# Patient Record
Sex: Female | Born: 1950
Health system: Southern US, Community
[De-identification: ages and names within clinical notes are randomized; demographics above are authoritative.]

## PROBLEM LIST (undated history)

## (undated) DIAGNOSIS — F419 Anxiety disorder, unspecified: Secondary | ICD-10-CM

## (undated) DIAGNOSIS — E663 Overweight: Secondary | ICD-10-CM

## (undated) DIAGNOSIS — N200 Calculus of kidney: Secondary | ICD-10-CM

## (undated) DIAGNOSIS — Z8619 Personal history of other infectious and parasitic diseases: Secondary | ICD-10-CM

## (undated) DIAGNOSIS — B019 Varicella without complication: Secondary | ICD-10-CM

## (undated) DIAGNOSIS — I701 Atherosclerosis of renal artery: Secondary | ICD-10-CM

## (undated) DIAGNOSIS — Z86010 Personal history of colonic polyps: Secondary | ICD-10-CM

## (undated) DIAGNOSIS — K219 Gastro-esophageal reflux disease without esophagitis: Secondary | ICD-10-CM

## (undated) DIAGNOSIS — K449 Diaphragmatic hernia without obstruction or gangrene: Secondary | ICD-10-CM

## (undated) DIAGNOSIS — K635 Polyp of colon: Secondary | ICD-10-CM

## (undated) DIAGNOSIS — E785 Hyperlipidemia, unspecified: Secondary | ICD-10-CM

## (undated) DIAGNOSIS — F329 Major depressive disorder, single episode, unspecified: Secondary | ICD-10-CM

## (undated) DIAGNOSIS — N6009 Solitary cyst of unspecified breast: Secondary | ICD-10-CM

## (undated) DIAGNOSIS — Z0000 Encounter for general adult medical examination without abnormal findings: Secondary | ICD-10-CM

## (undated) HISTORY — DX: Personal history of other infectious and parasitic diseases: Z86.19

## (undated) HISTORY — DX: Anxiety disorder, unspecified: F41.9

## (undated) HISTORY — DX: Gastro-esophageal reflux disease without esophagitis: K21.9

## (undated) HISTORY — DX: Hyperlipidemia, unspecified: E78.5

## (undated) HISTORY — DX: Atherosclerosis of renal artery: I70.1

## (undated) HISTORY — DX: Personal history of colonic polyps: Z86.010

## (undated) HISTORY — DX: Diaphragmatic hernia without obstruction or gangrene: K44.9

## (undated) HISTORY — DX: Calculus of kidney: N20.0

## (undated) HISTORY — DX: Varicella without complication: B01.9

## (undated) HISTORY — DX: Polyp of colon: K63.5

## (undated) HISTORY — DX: Overweight: E66.3

## (undated) HISTORY — DX: Major depressive disorder, single episode, unspecified: F32.9

## (undated) HISTORY — DX: Encounter for general adult medical examination without abnormal findings: Z00.00

## (undated) HISTORY — DX: Solitary cyst of unspecified breast: N60.09

---
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
## 1999-03-19 ENCOUNTER — Encounter: Admission: RE | Admit: 1999-03-19 | Discharge: 1999-03-19 | Payer: Self-pay | Admitting: Family Medicine

## 1999-03-19 ENCOUNTER — Encounter: Payer: Self-pay | Admitting: Family Medicine

## 2000-03-23 ENCOUNTER — Encounter: Payer: Self-pay | Admitting: Family Medicine

## 2000-03-23 ENCOUNTER — Encounter: Admission: RE | Admit: 2000-03-23 | Discharge: 2000-03-23 | Payer: Self-pay | Admitting: Family Medicine

## 2000-06-02 ENCOUNTER — Ambulatory Visit (HOSPITAL_COMMUNITY): Admission: RE | Admit: 2000-06-02 | Discharge: 2000-06-02 | Payer: Self-pay | Admitting: *Deleted

## 2001-03-26 ENCOUNTER — Encounter: Admission: RE | Admit: 2001-03-26 | Discharge: 2001-03-26 | Payer: Self-pay | Admitting: Family Medicine

## 2001-03-26 ENCOUNTER — Encounter: Payer: Self-pay | Admitting: Family Medicine

## 2002-03-28 ENCOUNTER — Encounter: Admission: RE | Admit: 2002-03-28 | Discharge: 2002-03-28 | Payer: Self-pay | Admitting: Family Medicine

## 2002-03-28 ENCOUNTER — Encounter: Payer: Self-pay | Admitting: Family Medicine

## 2003-03-31 ENCOUNTER — Encounter: Admission: RE | Admit: 2003-03-31 | Discharge: 2003-03-31 | Payer: Self-pay | Admitting: Family Medicine

## 2003-04-03 ENCOUNTER — Encounter: Admission: RE | Admit: 2003-04-03 | Discharge: 2003-04-03 | Payer: Self-pay | Admitting: Family Medicine

## 2004-04-06 ENCOUNTER — Encounter: Admission: RE | Admit: 2004-04-06 | Discharge: 2004-04-06 | Payer: Self-pay | Admitting: Family Medicine

## 2004-05-06 ENCOUNTER — Other Ambulatory Visit: Admission: RE | Admit: 2004-05-06 | Discharge: 2004-05-06 | Payer: Self-pay | Admitting: Family Medicine

## 2004-05-14 ENCOUNTER — Encounter: Admission: RE | Admit: 2004-05-14 | Discharge: 2004-05-14 | Payer: Self-pay | Admitting: Family Medicine

## 2004-08-16 HISTORY — PX: OTHER SURGICAL HISTORY: SHX169

## 2005-07-04 ENCOUNTER — Encounter: Admission: RE | Admit: 2005-07-04 | Discharge: 2005-07-04 | Payer: Self-pay | Admitting: Family Medicine

## 2005-07-12 ENCOUNTER — Encounter: Admission: RE | Admit: 2005-07-12 | Discharge: 2005-07-12 | Payer: Self-pay | Admitting: Family Medicine

## 2005-08-02 ENCOUNTER — Other Ambulatory Visit: Admission: RE | Admit: 2005-08-02 | Discharge: 2005-08-02 | Payer: Self-pay | Admitting: Family Medicine

## 2005-09-23 ENCOUNTER — Encounter: Payer: Self-pay | Admitting: Cardiovascular Disease

## 2005-10-06 ENCOUNTER — Ambulatory Visit: Payer: Self-pay | Admitting: Family Medicine

## 2005-10-11 ENCOUNTER — Encounter: Payer: Self-pay | Admitting: Cardiovascular Disease

## 2005-11-02 ENCOUNTER — Encounter: Payer: Self-pay | Admitting: Cardiovascular Disease

## 2005-12-28 ENCOUNTER — Encounter: Admission: RE | Admit: 2005-12-28 | Discharge: 2005-12-28 | Payer: Self-pay | Admitting: Family Medicine

## 2006-05-01 ENCOUNTER — Ambulatory Visit: Payer: Self-pay | Admitting: Family Medicine

## 2006-09-07 ENCOUNTER — Encounter: Admission: RE | Admit: 2006-09-07 | Discharge: 2006-09-07 | Payer: Self-pay | Admitting: Family Medicine

## 2006-12-15 ENCOUNTER — Ambulatory Visit: Payer: Self-pay | Admitting: Family Medicine

## 2007-08-30 ENCOUNTER — Ambulatory Visit: Payer: Self-pay | Admitting: Family Medicine

## 2007-09-21 ENCOUNTER — Encounter: Admission: RE | Admit: 2007-09-21 | Discharge: 2007-09-21 | Payer: Self-pay | Admitting: Family Medicine

## 2007-10-05 LAB — HM COLONOSCOPY: HM Colonoscopy: NORMAL

## 2008-03-20 ENCOUNTER — Ambulatory Visit: Payer: Self-pay | Admitting: Family Medicine

## 2008-09-22 ENCOUNTER — Encounter: Admission: RE | Admit: 2008-09-22 | Discharge: 2008-09-22 | Payer: Self-pay | Admitting: Family Medicine

## 2008-10-07 LAB — HM PAP SMEAR: HM Pap smear: NEGATIVE

## 2008-11-06 ENCOUNTER — Other Ambulatory Visit: Admission: RE | Admit: 2008-11-06 | Discharge: 2008-11-06 | Payer: Self-pay | Admitting: Family Medicine

## 2008-11-06 ENCOUNTER — Ambulatory Visit: Payer: Self-pay | Admitting: Family Medicine

## 2008-11-13 ENCOUNTER — Ambulatory Visit: Payer: Self-pay | Admitting: Cardiovascular Disease

## 2008-11-13 DIAGNOSIS — I739 Peripheral vascular disease, unspecified: Secondary | ICD-10-CM

## 2008-11-13 DIAGNOSIS — I701 Atherosclerosis of renal artery: Secondary | ICD-10-CM | POA: Insufficient documentation

## 2008-11-20 ENCOUNTER — Encounter: Payer: Self-pay | Admitting: Cardiovascular Disease

## 2008-12-05 ENCOUNTER — Encounter: Payer: Self-pay | Admitting: Cardiovascular Disease

## 2008-12-05 ENCOUNTER — Ambulatory Visit: Payer: Self-pay

## 2009-11-03 ENCOUNTER — Encounter: Admission: RE | Admit: 2009-11-03 | Discharge: 2009-11-03 | Payer: Self-pay | Admitting: Family Medicine

## 2009-11-03 LAB — HM MAMMOGRAPHY: HM Mammogram: NEGATIVE

## 2009-12-08 ENCOUNTER — Encounter: Payer: Self-pay | Admitting: Cardiovascular Disease

## 2009-12-09 ENCOUNTER — Ambulatory Visit: Payer: Self-pay

## 2009-12-09 ENCOUNTER — Encounter: Payer: Self-pay | Admitting: Cardiovascular Disease

## 2010-01-19 ENCOUNTER — Ambulatory Visit: Payer: Self-pay | Admitting: Cardiovascular Disease

## 2010-01-20 ENCOUNTER — Telehealth: Payer: Self-pay | Admitting: Cardiovascular Disease

## 2010-02-10 ENCOUNTER — Ambulatory Visit: Payer: Self-pay | Admitting: Physician Assistant

## 2010-05-09 ENCOUNTER — Encounter: Payer: Self-pay | Admitting: Family Medicine

## 2010-05-18 NOTE — Progress Notes (Signed)
Summary: Pt want to schedule a CT Scan  Phone Note Call from Patient Call back at Home Phone [phone]   Caller: Patient Summary of Call: Pt calling regarding scheduling CT Scan Initial call taken by: Judie Grieve,  January 20, 2010 10:17 AM  Follow-up for Phone Call        Dr. Clifton James does not wish to persue ordering a CTA at this time.  Whitney Maeola Sarah RN  January 20, 2010 3:32 PM

## 2010-05-18 NOTE — Miscellaneous (Signed)
Summary: Orders Update  Clinical Lists Changes  Orders: Added new Test order of Renal Artery Duplex (Renal Artery Duplex) - Signed 

## 2010-05-18 NOTE — Assessment & Plan Note (Signed)
Summary: f1y/ gd   Visit Type:  1 yr f/u Primary Provider:  Sharlot Gowda  CC:  no cardiac complaints today.  History of Present Illness: 60 yo WF with history of hyperlipidemia and renal artery stenosis with normal BP and normal renal function here today for PV follow up. She has been followed in the past by Dr. Allyson Sabal from [facility] and has had surveillance dopplers of her renal arteries every six months. Her RAS was diagnosed by primary care provider in 2007. Most recent dopplers in August 2011 showed 50% right renal artery stenosis and no evidence of Left renal artery stenosis. The kidney size was slightly decreased on the right and unchanged on the left. There was suggestion of elevated velocities in the left iliac artery. Her ABIs were normal on the right and left. She has had no claudication. She has had a normal stress Myoview in the past. She describes no chest pain, SOB or palpitations.    Current Medications (verified): 1)  Lipitor 20 Mg Tabs (Atorvastatin Calcium) .... Take One Tablet Once Daily 2)  Aspirin 81 Mg Tabs (Aspirin) .... Once Daily 3)  Multivitamins  Tabs (Multiple Vitamin) .... Once Daily  Allergies (verified): No Known Drug Allergies  Past History:  Past Medical History: Hyperlipidemia Renal artery stenosis   Social History: Reviewed history from 11/13/2008 and no changes required. 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 alcohol drinks per week Remote tobacco, stopped 20 y ago Single No children  Review of Systems  The patient denies fatigue, malaise, fever, weight gain/loss, vision loss, decreased hearing, hoarseness, chest pain, palpitations, shortness of breath, prolonged cough, wheezing, sleep apnea, coughing up blood, abdominal pain, blood in stool, nausea, vomiting, diarrhea, heartburn, incontinence, blood in urine, muscle weakness, joint pain, leg swelling, rash, skin lesions, headache, fainting, dizziness, depression, anxiety, enlarged lymph nodes,  easy bruising or bleeding, and environmental allergies.    Vital Signs:  Patient profile:   60 year old female Height:      67 inches Weight:      147 pounds BMI:     23.11 Pulse rate:   57 / minute Pulse rhythm:   irregular BP sitting:   134 / 80  (left arm) Cuff size:   large  Vitals Entered By: Danielle Rankin, CMA (January 19, 2010 9:50 AM)  Physical Exam  General:  General: Well developed, well nourished, NAD Musculoskeletal: Muscle strength 5/5 all ext Psychiatric: Mood and affect normal Neck: No JVD, no carotid bruits, no thyromegaly, no lymphadenopathy. Lungs:Clear bilaterally, no wheezes, rhonci, crackles CV: RRR no murmurs, gallops rubs Abdomen: soft, NT, ND, BS present Extremities: No edema, pulses 1+ bilateral PT. 2+ bilateral femoral.     EKG  Procedure date:  01/19/2010  Findings:      Sinus bradycardia, rate 57 bpm. Poor R wave progression through the precordial leads.   Impression & Recommendations:  Problem # 1:  PVD (ICD-443.9) Right renal artery stenosis stable by doppler in August. Right kidney size is stable. There is suggestion of left iliac artery stenosis. I have discussed a CTA to define the renal arteries and the iliac arteries. She does not wish to pursue this at this time. Will arrange surveillance renal artery dopplers and lower extremity artery dopplers in August of 2012. She will call us if her clinical status changes before then.   Patient Instructions: 1)  Your physician recommends that you schedule a follow-up appointment in: 1 year 2)  Your physician recommends that you continue on your  current medications as directed. Please refer to the Current Medication list given to you today. 3)  Your physician has requested that you have a lower extremity arterial duplex in 1 year.  This test is an ultrasound of the arteries in the legs or arms.  It looks at arterial blood flow in the legs and arms.  Allow one hour for Lower and Upper Arterial scans.  There are no restrictions or special instructions. 4)  Your physician has requested that you have a renal artery duplex in 1 year. During this test, an ultrasound is used to evaluate blood flow to the kidneys. Allow one hour for this exam. Do not eat after midnight the day before and avoid carbonated beverages. Take your medications as you usually do.

## 2010-05-18 NOTE — Miscellaneous (Signed)
Summary: Orders Update  Clinical Lists Changes  Orders: Added new Test order of Arterial Duplex Lower Extremity (Arterial Duplex Low) - Signed 

## 2010-10-19 ENCOUNTER — Encounter: Payer: Self-pay | Admitting: Family Medicine

## 2010-11-14 ENCOUNTER — Emergency Department (HOSPITAL_COMMUNITY)
Admission: EM | Admit: 2010-11-14 | Discharge: 2010-11-14 | Disposition: A | Discharge status: Home / Self Care | Payer: PRIVATE HEALTH INSURANCE | Attending: Emergency Medicine | Admitting: Emergency Medicine

## 2010-11-14 ENCOUNTER — Emergency Department (HOSPITAL_COMMUNITY): Payer: PRIVATE HEALTH INSURANCE

## 2010-11-14 DIAGNOSIS — N23 Unspecified renal colic: Secondary | ICD-10-CM | POA: Insufficient documentation

## 2010-11-14 DIAGNOSIS — R109 Unspecified abdominal pain: Secondary | ICD-10-CM | POA: Insufficient documentation

## 2010-11-14 LAB — COMPREHENSIVE METABOLIC PANEL
AST: 18 U/L (ref 0–37)
Albumin: 4.1 g/dL (ref 3.5–5.2)
Alkaline Phosphatase: 66 U/L (ref 39–117)
CO2: 23 mEq/L (ref 19–32)
Calcium: 9.6 mg/dL (ref 8.4–10.5)
Chloride: 106 mEq/L (ref 96–112)
Creatinine, Ser: 0.83 mg/dL (ref 0.50–1.10)
Glucose, Bld: 129 mg/dL — ABNORMAL HIGH (ref 70–99)
Potassium: 3.5 mEq/L (ref 3.5–5.1)
Sodium: 140 mEq/L (ref 135–145)
Total Bilirubin: 0.5 mg/dL (ref 0.3–1.2)
Total Protein: 6.7 g/dL (ref 6.0–8.3)

## 2010-11-14 LAB — URINALYSIS, ROUTINE W REFLEX MICROSCOPIC
Bilirubin Urine: NEGATIVE
Glucose, UA: NEGATIVE mg/dL
Hgb urine dipstick: NEGATIVE
Leukocytes, UA: NEGATIVE
Nitrite: NEGATIVE
Specific Gravity, Urine: 1.019 (ref 1.005–1.030)
pH: 7.5 (ref 5.0–8.0)

## 2010-11-14 LAB — URINE MICROSCOPIC-ADD ON

## 2010-11-14 LAB — CBC
Hemoglobin: 14.1 g/dL (ref 12.0–15.0)
MCHC: 34.2 g/dL (ref 30.0–36.0)
MCV: 90.2 fL (ref 78.0–100.0)
Platelets: 264 10*3/uL (ref 150–400)
RBC: 4.57 MIL/uL (ref 3.87–5.11)
RDW: 12.6 % (ref 11.5–15.5)
WBC: 6.3 10*3/uL (ref 4.0–10.5)

## 2010-11-14 LAB — DIFFERENTIAL
Basophils Absolute: 0 10*3/uL (ref 0.0–0.1)
Eosinophils Absolute: 0.2 10*3/uL (ref 0.0–0.7)
Eosinophils Relative: 2 % (ref 0–5)
Lymphocytes Relative: 43 % (ref 12–46)
Lymphs Abs: 2.7 10*3/uL (ref 0.7–4.0)
Monocytes Absolute: 0.4 10*3/uL (ref 0.1–1.0)
Neutrophils Relative %: 48 % (ref 43–77)

## 2010-11-14 LAB — LIPASE, BLOOD: Lipase: 22 U/L (ref 11–59)

## 2010-11-16 ENCOUNTER — Telehealth: Payer: Self-pay | Admitting: Cardiovascular Disease

## 2010-11-16 NOTE — Telephone Encounter (Signed)
Per pt call, pt wanted to inform nurse and MD that pt had a kidney stone this past weekend. Pt said MD was checking pt kidneys and wanted to make sure MD knew of this kidney stone passing.

## 2010-11-16 NOTE — Telephone Encounter (Signed)
Dr. Clifton James aware.

## 2011-03-04 ENCOUNTER — Telehealth: Payer: Self-pay | Admitting: Cardiovascular Disease

## 2011-03-04 ENCOUNTER — Other Ambulatory Visit: Payer: Self-pay | Admitting: Family Medicine

## 2011-03-04 DIAGNOSIS — I701 Atherosclerosis of renal artery: Secondary | ICD-10-CM

## 2011-03-04 DIAGNOSIS — Z1231 Encounter for screening mammogram for malignant neoplasm of breast: Secondary | ICD-10-CM

## 2011-03-04 DIAGNOSIS — I739 Peripheral vascular disease, unspecified: Secondary | ICD-10-CM

## 2011-03-04 NOTE — Telephone Encounter (Signed)
Spoke with pt and gave her information below and transferred to [facility] to make appts.

## 2011-03-04 NOTE — Telephone Encounter (Signed)
New problem Pt called and said she thought she was supposed to have renal doppler before her appt with Dr Clifton James

## 2011-03-04 NOTE — Telephone Encounter (Signed)
Last office note reviewed. Pt is due for renal artery doppler and lower extremity arterial doppler. Called to give information to pt. She is at lunch.  Will try again to reach her later.

## 2011-03-24 ENCOUNTER — Ambulatory Visit
Admission: RE | Admit: 2011-03-24 | Discharge: 2011-03-24 | Disposition: A | Discharge status: Home / Self Care | Payer: PRIVATE HEALTH INSURANCE | Source: Ambulatory Visit | Attending: Family Medicine | Admitting: Family Medicine

## 2011-03-24 DIAGNOSIS — Z1231 Encounter for screening mammogram for malignant neoplasm of breast: Secondary | ICD-10-CM

## 2011-04-06 ENCOUNTER — Encounter (INDEPENDENT_AMBULATORY_CARE_PROVIDER_SITE_OTHER): Payer: PRIVATE HEALTH INSURANCE | Admitting: Cardiology

## 2011-04-06 DIAGNOSIS — I739 Peripheral vascular disease, unspecified: Secondary | ICD-10-CM

## 2011-04-06 DIAGNOSIS — I701 Atherosclerosis of renal artery: Secondary | ICD-10-CM

## 2012-01-30 ENCOUNTER — Encounter: Payer: Self-pay | Admitting: Internal Medicine

## 2012-06-13 ENCOUNTER — Other Ambulatory Visit: Payer: Self-pay

## 2012-06-13 DIAGNOSIS — Z1231 Encounter for screening mammogram for malignant neoplasm of breast: Secondary | ICD-10-CM

## 2012-07-12 ENCOUNTER — Ambulatory Visit
Admission: RE | Admit: 2012-07-12 | Discharge: 2012-07-12 | Disposition: A | Discharge status: Home / Self Care | Payer: PRIVATE HEALTH INSURANCE | Source: Ambulatory Visit

## 2012-07-12 DIAGNOSIS — Z1231 Encounter for screening mammogram for malignant neoplasm of breast: Secondary | ICD-10-CM

## 2012-08-24 ENCOUNTER — Encounter: Payer: Self-pay | Admitting: Medical

## 2012-08-24 ENCOUNTER — Ambulatory Visit (INDEPENDENT_AMBULATORY_CARE_PROVIDER_SITE_OTHER): Payer: PRIVATE HEALTH INSURANCE | Admitting: Medical

## 2012-08-24 VITALS — BP 138/78 | HR 68 | Temp 98.7°F | Resp 16 | Wt 168.0 lb

## 2012-08-24 DIAGNOSIS — W57XXXA Bitten or stung by nonvenomous insect and other nonvenomous arthropods, initial encounter: Secondary | ICD-10-CM

## 2012-08-24 DIAGNOSIS — L0291 Cutaneous abscess, unspecified: Secondary | ICD-10-CM

## 2012-08-24 DIAGNOSIS — L0390 Cellulitis, unspecified: Secondary | ICD-10-CM

## 2012-08-24 MED ORDER — DOXYCYCLINE HYCLATE 100 MG PO TABS: 100.0000 mg | ORAL_TABLET | Freq: Two times a day (BID) | ORAL | Status: DC

## 2012-08-24 NOTE — Patient Instructions (Signed)
The area looks to be early cellulitis/skin infection in the area of the tick bite  Begin warm moist compresses  You can take Ibuprofen for pain, inflammation and swelling  consider Benadryl 1-2 times daily  Keep the area clean  If worse symptoms or not improving, then recheck

## 2012-08-24 NOTE — Progress Notes (Signed)
Subjective: Saw tick on her left pubic area earlier this week.  It was small, pulled it off, but not sure she got it all.  Since then she notes some drainage from the spot, has some redness and swelling close by.  Is itchy.  Using alcohol wipes, neosporin.  Denies fever, chills, headache, no NVD, no other aches and pain, no other rash.  Has 2 dogs, and thinks it came from her dogs.    Past Medical History  Diagnosis Date  . Dyslipidemia   . Hiatal hernia   . Breast cyst   . FH: colonic polyps   . Renal artery stenosis    ROS as subjective  Objective: Gen: wd, wn, nad Skin;left pubic region with small 2mm erythematous crusting lesion with some purulent/serous drainage, generalized swelling of the area with faint erythema, but asymmetric compared to right, no other skin findings or obvious ticks throughout body  Assessment: Encounter Diagnoses  Name Primary?  . Tick bite Yes  . Cellulitis    Plan: Patient Instructions  The area looks to be early cellulitis/skin infection in the area of the tick bite  Begin warm moist compresses  You can take Ibuprofen for pain, inflammation and swelling  consider Benadryl 1-2 times daily  Keep the area clean  If worse symptoms or not improving, then recheck

## 2012-10-04 ENCOUNTER — Encounter: Payer: Self-pay | Admitting: Family Medicine

## 2012-10-04 ENCOUNTER — Ambulatory Visit (INDEPENDENT_AMBULATORY_CARE_PROVIDER_SITE_OTHER): Payer: PRIVATE HEALTH INSURANCE | Admitting: Family Medicine

## 2012-10-04 VITALS — BP 108/62 | HR 69 | Temp 98.0°F | Ht 67.0 in | Wt 169.1 lb

## 2012-10-04 DIAGNOSIS — N200 Calculus of kidney: Secondary | ICD-10-CM

## 2012-10-04 DIAGNOSIS — Z8619 Personal history of other infectious and parasitic diseases: Secondary | ICD-10-CM

## 2012-10-04 DIAGNOSIS — E785 Hyperlipidemia, unspecified: Secondary | ICD-10-CM

## 2012-10-04 DIAGNOSIS — Z780 Asymptomatic menopausal state: Secondary | ICD-10-CM

## 2012-10-04 DIAGNOSIS — Z860100 Personal history of colon polyps, unspecified: Secondary | ICD-10-CM | POA: Insufficient documentation

## 2012-10-04 DIAGNOSIS — K219 Gastro-esophageal reflux disease without esophagitis: Secondary | ICD-10-CM

## 2012-10-04 DIAGNOSIS — N6009 Solitary cyst of unspecified breast: Secondary | ICD-10-CM | POA: Insufficient documentation

## 2012-10-04 DIAGNOSIS — I701 Atherosclerosis of renal artery: Secondary | ICD-10-CM

## 2012-10-04 DIAGNOSIS — Z0000 Encounter for general adult medical examination without abnormal findings: Secondary | ICD-10-CM

## 2012-10-04 DIAGNOSIS — E782 Mixed hyperlipidemia: Secondary | ICD-10-CM | POA: Insufficient documentation

## 2012-10-04 HISTORY — DX: Calculus of kidney: N20.0

## 2012-10-04 HISTORY — DX: Encounter for general adult medical examination without abnormal findings: Z00.00

## 2012-10-04 HISTORY — DX: Personal history of other infectious and parasitic diseases: Z86.19

## 2012-10-04 HISTORY — DX: Hyperlipidemia, unspecified: E78.5

## 2012-10-04 NOTE — Assessment & Plan Note (Signed)
Agrees to referral for screening colonoscopy at next visit. No concerning symptoms

## 2012-10-04 NOTE — Patient Instructions (Addendum)
Call insurance regarding Zostavax, Shingles shot Consider a krill oil cap such as MegaRed daily Labs prior to next visit, lipid, renal, cbc, tsh, hepatic, hgba1c for preventative hyperlipid and hyper glycemia Needs next visit GYN   Preventive Care for Adults, Female A healthy lifestyle and preventive care can promote health and wellness. Preventive health guidelines for women include the following key practices.  A routine yearly physical is a good way to check with your caregiver about your health and preventive screening. It is a chance to share any concerns and updates on your health, and to receive a thorough exam.  Visit your dentist for a routine exam and preventive care every 6 months. Brush your teeth twice a day and floss once a day. Good oral hygiene prevents tooth decay and gum disease.  The frequency of eye exams is based on your age, health, family medical history, use of contact lenses, and other factors. Follow your caregiver's recommendations for frequency of eye exams.  Eat a healthy diet. Foods like vegetables, fruits, whole grains, low-fat dairy products, and lean protein foods contain the nutrients you need without too many calories. Decrease your intake of foods high in solid fats, added sugars, and salt. Eat the right amount of calories for you.Get information about a proper diet from your caregiver, if necessary.  Regular physical exercise is one of the most important things you can do for your health. Most adults should get at least 150 minutes of moderate-intensity exercise (any activity that increases your heart rate and causes you to sweat) each week. In addition, most adults need muscle-strengthening exercises on 2 or more days a week.  Maintain a healthy weight. The body mass index (BMI) is a screening tool to identify possible weight problems. It provides an estimate of body fat based on height and weight. Your caregiver can help determine your BMI, and can help you  achieve or maintain a healthy weight.For adults 20 years and older:  A BMI below 18.5 is considered underweight.  A BMI of 18.5 to 24.9 is normal.  A BMI of 25 to 29.9 is considered overweight.  A BMI of 30 and above is considered obese.  Maintain normal blood lipids and cholesterol levels by exercising and minimizing your intake of saturated fat. Eat a balanced diet with plenty of fruit and vegetables. Blood tests for lipids and cholesterol should begin at age 69 and be repeated every 5 years. If your lipid or cholesterol levels are high, you are over 50, or you are at high risk for heart disease, you may need your cholesterol levels checked more frequently.Ongoing high lipid and cholesterol levels should be treated with medicines if diet and exercise are not effective.  If you smoke, find out from your caregiver how to quit. If you do not use tobacco, do not start.  If you are pregnant, do not drink alcohol. If you are breastfeeding, be very cautious about drinking alcohol. If you are not pregnant and choose to drink alcohol, do not exceed 1 drink per day. One drink is considered to be 12 ounces (355 mL) of beer, 5 ounces (148 mL) of wine, or 1.5 ounces (44 mL) of liquor.  Avoid use of street drugs. Do not share needles with anyone. Ask for help if you need support or instructions about stopping the use of drugs.  High blood pressure causes heart disease and increases the risk of stroke. Your blood pressure should be checked at least every 1 to 2 years. Ongoing  high blood pressure should be treated with medicines if weight loss and exercise are not effective.  If you are 68 to 60 years old, ask your caregiver if you should take aspirin to prevent strokes.  Diabetes screening involves taking a blood sample to check your fasting blood sugar level. This should be done once every 3 years, after age 38, if you are within normal weight and without risk factors for diabetes. Testing should be  considered at a younger age or be carried out more frequently if you are overweight and have at least 1 risk factor for diabetes.  Breast cancer screening is essential preventive care for women. You should practice "breast self-awareness." This means understanding the normal appearance and feel of your breasts and may include breast self-examination. Any changes detected, no matter how small, should be reported to a caregiver. Women in their 33s and 30s should have a clinical breast exam (CBE) by a caregiver as part of a regular health exam every 1 to 3 years. After age 46, women should have a CBE every year. Starting at age 39, women should consider having a mammography (breast X-ray test) every year. Women who have a family history of breast cancer should talk to their caregiver about genetic screening. Women at a high risk of breast cancer should talk to their caregivers about having magnetic resonance imaging (MRI) and a mammography every year.  The Pap test is a screening test for cervical cancer. A Pap test can show cell changes on the cervix that might become cervical cancer if left untreated. A Pap test is a procedure in which cells are obtained and examined from the lower end of the uterus (cervix).  Women should have a Pap test starting at age 13.  Between ages 56 and 31, Pap tests should be repeated every 2 years.  Beginning at age 42, you should have a Pap test every 3 years as long as the past 3 Pap tests have been normal.  Some women have medical problems that increase the chance of getting cervical cancer. Talk to your caregiver about these problems. It is especially important to talk to your caregiver if a new problem develops soon after your last Pap test. In these cases, your caregiver may recommend more frequent screening and Pap tests.  The above recommendations are the same for women who have or have not gotten the vaccine for human papillomavirus (HPV).  If you had a  hysterectomy for a problem that was not cancer or a condition that could lead to cancer, then you no longer need Pap tests. Even if you no longer need a Pap test, a regular exam is a good idea to make sure no other problems are starting.  If you are between ages 63 and 76, and you have had normal Pap tests going back 10 years, you no longer need Pap tests. Even if you no longer need a Pap test, a regular exam is a good idea to make sure no other problems are starting.  If you have had past treatment for cervical cancer or a condition that could lead to cancer, you need Pap tests and screening for cancer for at least 20 years after your treatment.  If Pap tests have been discontinued, risk factors (such as a new sexual partner) need to be reassessed to determine if screening should be resumed.  The HPV test is an additional test that may be used for cervical cancer screening. The HPV test looks for the  virus that can cause the cell changes on the cervix. The cells collected during the Pap test can be tested for HPV. The HPV test could be used to screen women aged 19 years and older, and should be used in women of any age who have unclear Pap test results. After the age of 26, women should have HPV testing at the same frequency as a Pap test.  Colorectal cancer can be detected and often prevented. Most routine colorectal cancer screening begins at the age of 53 and continues through age 26. However, your caregiver may recommend screening at an earlier age if you have risk factors for colon cancer. On a yearly basis, your caregiver may provide home test kits to check for hidden blood in the stool. Use of a small camera at the end of a tube, to directly examine the colon (sigmoidoscopy or colonoscopy), can detect the earliest forms of colorectal cancer. Talk to your caregiver about this at age 68, when routine screening begins. Direct examination of the colon should be repeated every 5 to 10 years through age  55, unless early forms of pre-cancerous polyps or small growths are found.  Hepatitis C blood testing is recommended for all people born from [birth year] through 1965 and any individual with known risks for hepatitis C.  Practice safe sex. Use condoms and avoid high-risk sexual practices to reduce the spread of sexually transmitted infections (STIs). STIs include gonorrhea, chlamydia, syphilis, trichomonas, herpes, HPV, and human immunodeficiency virus (HIV). Herpes, HIV, and HPV are viral illnesses that have no cure. They can result in disability, cancer, and death. Sexually active women aged 43 and younger should be checked for chlamydia. Older women with new or multiple partners should also be tested for chlamydia. Testing for other STIs is recommended if you are sexually active and at increased risk.  Osteoporosis is a disease in which the bones lose minerals and strength with aging. This can result in serious bone fractures. The risk of osteoporosis can be identified using a bone density scan. Women ages 51 and over and women at risk for fractures or osteoporosis should discuss screening with their caregivers. Ask your caregiver whether you should take a calcium supplement or vitamin D to reduce the rate of osteoporosis.  Menopause can be associated with physical symptoms and risks. Hormone replacement therapy is available to decrease symptoms and risks. You should talk to your caregiver about whether hormone replacement therapy is right for you.  Use sunscreen with sun protection factor (SPF) of 30 or more. Apply sunscreen liberally and repeatedly throughout the day. You should seek shade when your shadow is shorter than you. Protect yourself by wearing long sleeves, pants, a wide-brimmed hat, and sunglasses year round, whenever you are outdoors.  Once a month, do a whole body skin exam, using a mirror to look at the skin on your back. Notify your caregiver of new moles, moles that have irregular borders,  moles that are larger than a pencil eraser, or moles that have changed in shape or color.  Stay current with required immunizations.  Influenza. You need a dose every fall (or winter). The composition of the flu vaccine changes each year, so being vaccinated once is not enough.  Pneumococcal polysaccharide. You need 1 to 2 doses if you smoke cigarettes or if you have certain chronic medical conditions. You need 1 dose at age 37 (or older) if you have never been vaccinated.  Tetanus, diphtheria, pertussis (Tdap, Td). Get 1 dose of Tdap  vaccine if you are younger than age 71, are over 60 and have contact with an infant, are a Research scientist (physical sciences), are pregnant, or simply want to be protected from whooping cough. After that, you need a Td booster dose every 10 years. Consult your caregiver if you have not had at least 3 tetanus and diphtheria-containing shots sometime in your life or have a deep or dirty wound.  HPV. You need this vaccine if you are a woman age 70 or younger. The vaccine is given in 3 doses over 6 months.  Measles, mumps, rubella (MMR). You need at least 1 dose of MMR if you were born in 1957 or later. You may also need a second dose.  Meningococcal. If you are age 12 to 53 and a first-year college student living in a residence hall, or have one of several medical conditions, you need to get vaccinated against meningococcal disease. You may also need additional booster doses.  Zoster (shingles). If you are age 27 or older, you should get this vaccine.  Varicella (chickenpox). If you have never had chickenpox or you were vaccinated but received only 1 dose, talk to your caregiver to find out if you need this vaccine.  Hepatitis A. You need this vaccine if you have a specific risk factor for hepatitis A virus infection or you simply wish to be protected from this disease. The vaccine is usually given as 2 doses, 6 to 18 months apart.  Hepatitis B. You need this vaccine if you have a  specific risk factor for hepatitis B virus infection or you simply wish to be protected from this disease. The vaccine is given in 3 doses, usually over 6 months. Preventive Services / Frequency Ages 58 to 44  Blood pressure check.** / Every 1 to 2 years.  Lipid and cholesterol check.** / Every 5 years beginning at age 15.  Clinical breast exam.** / Every 3 years for women in their 44s and 30s.  Pap test.** / Every 2 years from ages 6 through 20. Every 3 years starting at age 65 through age 60 or 66 with a history of 3 consecutive normal Pap tests.  HPV screening.** / Every 3 years from ages 52 through ages 74 to 79 with a history of 3 consecutive normal Pap tests.  Hepatitis C blood test.** / For any individual with known risks for hepatitis C.  Skin self-exam. / Monthly.  Influenza immunization.** / Every year.  Pneumococcal polysaccharide immunization.** / 1 to 2 doses if you smoke cigarettes or if you have certain chronic medical conditions.  Tetanus, diphtheria, pertussis (Tdap, Td) immunization. / A one-time dose of Tdap vaccine. After that, you need a Td booster dose every 10 years.  HPV immunization. / 3 doses over 6 months, if you are 27 and younger.  Measles, mumps, rubella (MMR) immunization. / You need at least 1 dose of MMR if you were born in 1957 or later. You may also need a second dose.  Meningococcal immunization. / 1 dose if you are age 75 to 71 and a first-year college student living in a residence hall, or have one of several medical conditions, you need to get vaccinated against meningococcal disease. You may also need additional booster doses.  Varicella immunization.** / Consult your caregiver.  Hepatitis A immunization.** / Consult your caregiver. 2 doses, 6 to 18 months apart.  Hepatitis B immunization.** / Consult your caregiver. 3 doses usually over 6 months. Ages 66 to 63  Blood pressure check.** /  Every 1 to 2 years.  Lipid and cholesterol  check.** / Every 5 years beginning at age 25.  Clinical breast exam.** / Every year after age 34.  Mammogram.** / Every year beginning at age 65 and continuing for as long as you are in good health. Consult with your caregiver.  Pap test.** / Every 3 years starting at age 71 through age 43 or 39 with a history of 3 consecutive normal Pap tests.  HPV screening.** / Every 3 years from ages 72 through ages 76 to 74 with a history of 3 consecutive normal Pap tests.  Fecal occult blood test (FOBT) of stool. / Every year beginning at age 96 and continuing until age 87. You may not need to do this test if you get a colonoscopy every 10 years.  Flexible sigmoidoscopy or colonoscopy.** / Every 5 years for a flexible sigmoidoscopy or every 10 years for a colonoscopy beginning at age 48 and continuing until age 31.  Hepatitis C blood test.** / For all people born from [birth year] through 1965 and any individual with known risks for hepatitis C.  Skin self-exam. / Monthly.  Influenza immunization.** / Every year.  Pneumococcal polysaccharide immunization.** / 1 to 2 doses if you smoke cigarettes or if you have certain chronic medical conditions.  Tetanus, diphtheria, pertussis (Tdap, Td) immunization.** / A one-time dose of Tdap vaccine. After that, you need a Td booster dose every 10 years.  Measles, mumps, rubella (MMR) immunization. / You need at least 1 dose of MMR if you were born in 1957 or later. You may also need a second dose.  Varicella immunization.** / Consult your caregiver.  Meningococcal immunization.** / Consult your caregiver.  Hepatitis A immunization.** / Consult your caregiver. 2 doses, 6 to 18 months apart.  Hepatitis B immunization.** / Consult your caregiver. 3 doses, usually over 6 months. Ages 31 and over  Blood pressure check.** / Every 1 to 2 years.  Lipid and cholesterol check.** / Every 5 years beginning at age 84.  Clinical breast exam.** / Every year after age  66.  Mammogram.** / Every year beginning at age 2 and continuing for as long as you are in good health. Consult with your caregiver.  Pap test.** / Every 3 years starting at age 98 through age 53 or 66 with a 3 consecutive normal Pap tests. Testing can be stopped between 65 and 70 with 3 consecutive normal Pap tests and no abnormal Pap or HPV tests in the past 10 years.  HPV screening.** / Every 3 years from ages 58 through ages 72 or 42 with a history of 3 consecutive normal Pap tests. Testing can be stopped between 65 and 70 with 3 consecutive normal Pap tests and no abnormal Pap or HPV tests in the past 10 years.  Fecal occult blood test (FOBT) of stool. / Every year beginning at age 19 and continuing until age 58. You may not need to do this test if you get a colonoscopy every 10 years.  Flexible sigmoidoscopy or colonoscopy.** / Every 5 years for a flexible sigmoidoscopy or every 10 years for a colonoscopy beginning at age 30 and continuing until age 68.  Hepatitis C blood test.** / For all people born from [birth year] through 1965 and any individual with known risks for hepatitis C.  Osteoporosis screening.** / A one-time screening for women ages 52 and over and women at risk for fractures or osteoporosis.  Skin self-exam. / Monthly.  Influenza immunization.** / Every  year.  Pneumococcal polysaccharide immunization.** / 1 dose at age 15 (or older) if you have never been vaccinated.  Tetanus, diphtheria, pertussis (Tdap, Td) immunization. / A one-time dose of Tdap vaccine if you are over 65 and have contact with an infant, are a Research scientist (physical sciences), or simply want to be protected from whooping cough. After that, you need a Td booster dose every 10 years.  Varicella immunization.** / Consult your caregiver.  Meningococcal immunization.** / Consult your caregiver.  Hepatitis A immunization.** / Consult your caregiver. 2 doses, 6 to 18 months apart.  Hepatitis B immunization.** / Check with  your caregiver. 3 doses, usually over 6 months. ** Family history and personal history of risk and conditions may change your caregiver's recommendations. Document Released: 05/31/2001 Document Revised: 06/27/2011 Document Reviewed: 08/30/2010 [facility] Patient Information 2014 [place], [place].

## 2012-10-04 NOTE — Assessment & Plan Note (Signed)
No recent episode °

## 2012-10-04 NOTE — Assessment & Plan Note (Signed)
Generally well controlled with diet, uses an infrequent Tums, may continue same

## 2012-10-04 NOTE — Assessment & Plan Note (Signed)
Previously on Lipitor. Has not taken in 3 years. Encouraged to start Krill oil and avoid transplants. Check a lipid panel prior to next visit

## 2012-10-04 NOTE — Progress Notes (Signed)
Patient ID: Karen Pierce, female   DOB: 20-Nov-1950, 62 y.o.   MRN: [id number] ROZELLE CAUDLE [id number] 1950/11/02 10/04/2012      Progress Note-Follow Up  Subjective  Chief Complaint  Chief Complaint  Patient presents with  . Establish Care    new patient    HPI  Patient is a 62 -year-old Caucasian female establish care. She is in good health but needs an ongoing primary care Dr. Has not had preventative care in a couple of years. No recent illness. Has occasional heartburn has responded well to TUMS occasionally. She's had an episode of kidney stones in the past but none recently. Had an episode of shingles in the past but none recently. Denies any chest pain or palpitations, headache, GI or GU complaints at this time. Does not take any routine medications. Did take Lipitor in the past but has not taken for several years  Past Medical History  Diagnosis Date  . Hiatal hernia   . Breast cyst     dense  . Chicken pox as a kid  . Hyperlipidemia   . GERD (gastroesophageal reflux disease)   . Renal artery stenosis     kidney stone  . Colon polyp     Past Surgical History  Procedure Laterality Date  . Polpypectomy  08/2004  . Cyst removed off left side  1968    benign, skin    Family History  Problem Relation Age of Onset  . Arthritis Mother   . Heart disease Mother   . Cancer Mother 33    breast, sarcoma x 3 bouts in past, under righ arm  . Hyperlipidemia Mother   . Ulcers Father   . Hypertension Father   . Arthritis Father   . Heart disease Maternal Grandmother   . Heart disease Paternal Grandmother   . Heart disease Paternal Grandfather   . Ulcers Paternal Grandfather   . Cancer Brother     pancreatic  . Heart disease Maternal Grandfather   . Heart attack Maternal Grandfather   . Pneumonia Sister   . Arthritis Sister     rheumatoid    History   Social History  . Marital Status: Single    Spouse Name: N/A    Number of Children: N/A  . Years of  Education: N/A   Occupational History  . Not on file.   Social History Main Topics  . Smoking status: Former Smoker -- 0.50 packs/day for 10 years    Types: Cigarettes    Start date: 04/18/1988  . Smokeless tobacco: Never Used  . Alcohol Use: Yes     Comment: occasionally  . Drug Use: No  . Sexually Active: Yes -- Female partner(s)   Other Topics Concern  . Not on file   Social History Narrative  . No narrative on file    Current Outpatient Prescriptions on File Prior to Visit  Medication Sig Dispense Refill  . aspirin 81 MG EC tablet Take 81 mg by mouth daily.        . Multiple Vitamins-Minerals (MULTIVITAMIN WITH MINERALS) tablet Take 1 tablet by mouth daily.         No current facility-administered medications on file prior to visit.    No Known Allergies  Review of Systems  Review of Systems  Constitutional: Negative for fever and malaise/fatigue.  HENT: Negative for congestion.   Eyes: Negative for discharge.  Respiratory: Negative for shortness of breath.   Cardiovascular: Negative for chest pain, palpitations and  leg swelling.  Gastrointestinal: Negative for nausea, abdominal pain and diarrhea.  Genitourinary: Negative for dysuria.  Musculoskeletal: Negative for falls.  Skin: Negative for rash.  Neurological: Negative for loss of consciousness and headaches.  Endo/Heme/Allergies: Negative for polydipsia.  Psychiatric/Behavioral: Negative for depression and suicidal ideas. The patient is not nervous/anxious and does not have insomnia.     Objective  BP 108/62  Pulse 69  Temp(Src) 98 F (36.7 C) (Oral)  Ht 5\' 7"  (1.702 m)  Wt 169 lb 1.3 oz (76.694 kg)  BMI 26.48 kg/m2  SpO2 97%  LMP 04/19/2007  Physical Exam  Physical Exam  Constitutional: She is oriented to person, place, and time and well-developed, well-nourished, and in no distress. No distress.  HENT:  Head: Normocephalic and atraumatic.  Eyes: Conjunctivae are normal.  Neck: Neck supple. No  thyromegaly present.  Cardiovascular: Normal rate, regular rhythm and normal heart sounds.   No murmur heard. Pulmonary/Chest: Effort normal and breath sounds normal. She has no wheezes.  Abdominal: She exhibits no distension and no mass.  Musculoskeletal: She exhibits no edema.  Lymphadenopathy:    She has no cervical adenopathy.  Neurological: She is alert and oriented to person, place, and time.  Skin: Skin is warm and dry. No rash noted. She is not diaphoretic.  Psychiatric: Memory, affect and judgment normal.    No results found for this basename: TSH   Lab Results  Component Value Date   WBC 6.3 11/14/2010   HGB 14.1 11/14/2010   HCT 41.2 11/14/2010   MCV 90.2 11/14/2010   PLT 264 11/14/2010   Lab Results  Component Value Date   CREATININE 0.83 11/14/2010   BUN 21 11/14/2010   NA 140 11/14/2010   K 3.5 11/14/2010   CL 106 11/14/2010   CO2 23 11/14/2010   Lab Results  Component Value Date   ALT 16 11/14/2010   AST 18 11/14/2010   ALKPHOS 66 11/14/2010   BILITOT 0.5 11/14/2010     Assessment & Plan  Preventative health care No pap in past 5 years. Agrees to return for pap in next month. MGM this year normal. Colonoscopy 2009 told to repeat in 5 years. Deferred referral to next visit. Schedule fasting labs for prior to next visit. She will check with her insurance to see if they will cover the zostavax  Personal history of colonic polyps Agrees to referral for screening colonoscopy at next visit. No concerning symptoms  RENAL ARTERY STENOSIS Is due for reevaluation, referral is placed today  GERD (gastroesophageal reflux disease) Generally well controlled with diet, uses an infrequent Tums, may continue same  Kidney stone No recent episode  Other and unspecified hyperlipidemia Previously on Lipitor. Has not taken in 3 years. Encouraged to start Krill oil and avoid transplants. Check a lipid panel prior to next visit

## 2012-10-04 NOTE — Assessment & Plan Note (Signed)
Is due for reevaluation, referral is placed today

## 2012-10-04 NOTE — Assessment & Plan Note (Addendum)
No pap in past 5 years. Agrees to return for pap in next month. MGM this year normal. Colonoscopy 2009 told to repeat in 5 years. Deferred referral to next visit. Schedule fasting labs for prior to next visit. She will check with her insurance to see if they will cover the zostavax

## 2012-10-15 ENCOUNTER — Inpatient Hospital Stay: Admission: RE | Admit: 2012-10-15 | Payer: PRIVATE HEALTH INSURANCE | Source: Ambulatory Visit

## 2012-10-23 ENCOUNTER — Ambulatory Visit (INDEPENDENT_AMBULATORY_CARE_PROVIDER_SITE_OTHER)
Admission: RE | Admit: 2012-10-23 | Discharge: 2012-10-23 | Disposition: A | Discharge status: Home / Self Care | Payer: PRIVATE HEALTH INSURANCE | Source: Ambulatory Visit | Attending: Family Medicine | Admitting: Family Medicine

## 2012-10-23 DIAGNOSIS — Z780 Asymptomatic menopausal state: Secondary | ICD-10-CM

## 2012-10-31 NOTE — Progress Notes (Signed)
Quick Note:  Patient Informed and voiced understanding ______ 

## 2012-11-07 ENCOUNTER — Telehealth: Payer: Self-pay

## 2012-11-07 DIAGNOSIS — E785 Hyperlipidemia, unspecified: Secondary | ICD-10-CM

## 2012-11-07 DIAGNOSIS — R739 Hyperglycemia, unspecified: Secondary | ICD-10-CM

## 2012-11-07 NOTE — Telephone Encounter (Signed)
Lab order placed.

## 2012-11-08 LAB — RENAL FUNCTION PANEL
CO2: 28 mEq/L (ref 19–32)
Chloride: 105 mEq/L (ref 96–112)
Potassium: 4.7 mEq/L (ref 3.5–5.3)
Sodium: 141 mEq/L (ref 135–145)

## 2012-11-08 LAB — CBC
HCT: 41.7 % (ref 36.0–46.0)
MCV: 90.7 fL (ref 78.0–100.0)
RBC: 4.6 MIL/uL (ref 3.87–5.11)
WBC: 7.1 10*3/uL (ref 4.0–10.5)

## 2012-11-08 LAB — LIPID PANEL
LDL Cholesterol: 124 mg/dL — ABNORMAL HIGH (ref 0–99)
Triglycerides: 69 mg/dL (ref <150)

## 2012-11-08 LAB — HEPATIC FUNCTION PANEL
Albumin: 4.3 g/dL (ref 3.5–5.2)
Bilirubin, Direct: 0.1 mg/dL (ref 0.0–0.3)
Indirect Bilirubin: 0.3 mg/dL (ref 0.0–0.9)
Total Bilirubin: 0.4 mg/dL (ref 0.3–1.2)
Total Protein: 6.4 g/dL (ref 6.0–8.3)

## 2012-11-08 LAB — HEMOGLOBIN A1C: Hgb A1c MFr Bld: 5.3 % (ref <5.7)

## 2012-11-16 ENCOUNTER — Ambulatory Visit (INDEPENDENT_AMBULATORY_CARE_PROVIDER_SITE_OTHER): Payer: PRIVATE HEALTH INSURANCE | Admitting: Family Medicine

## 2012-11-16 ENCOUNTER — Other Ambulatory Visit: Payer: Self-pay | Admitting: Family Medicine

## 2012-11-16 ENCOUNTER — Other Ambulatory Visit (HOSPITAL_COMMUNITY)
Admission: RE | Admit: 2012-11-16 | Discharge: 2012-11-16 | Disposition: A | Discharge status: Home / Self Care | Payer: PRIVATE HEALTH INSURANCE | Source: Ambulatory Visit | Attending: Family Medicine | Admitting: Family Medicine

## 2012-11-16 ENCOUNTER — Encounter: Payer: Self-pay | Admitting: Family Medicine

## 2012-11-16 VITALS — BP 112/62 | HR 62 | Temp 98.6°F | Wt 172.4 lb

## 2012-11-16 DIAGNOSIS — F341 Dysthymic disorder: Secondary | ICD-10-CM

## 2012-11-16 DIAGNOSIS — I701 Atherosclerosis of renal artery: Secondary | ICD-10-CM

## 2012-11-16 DIAGNOSIS — Z124 Encounter for screening for malignant neoplasm of cervix: Secondary | ICD-10-CM | POA: Insufficient documentation

## 2012-11-16 DIAGNOSIS — F32A Depression, unspecified: Secondary | ICD-10-CM

## 2012-11-16 DIAGNOSIS — Z0000 Encounter for general adult medical examination without abnormal findings: Secondary | ICD-10-CM

## 2012-11-16 DIAGNOSIS — F419 Anxiety disorder, unspecified: Secondary | ICD-10-CM

## 2012-11-16 DIAGNOSIS — E785 Hyperlipidemia, unspecified: Secondary | ICD-10-CM

## 2012-11-16 DIAGNOSIS — F329 Major depressive disorder, single episode, unspecified: Secondary | ICD-10-CM | POA: Insufficient documentation

## 2012-11-16 DIAGNOSIS — Z01419 Encounter for gynecological examination (general) (routine) without abnormal findings: Secondary | ICD-10-CM | POA: Insufficient documentation

## 2012-11-16 DIAGNOSIS — K219 Gastro-esophageal reflux disease without esophagitis: Secondary | ICD-10-CM

## 2012-11-16 HISTORY — DX: Anxiety disorder, unspecified: F41.9

## 2012-11-16 HISTORY — DX: Depression, unspecified: F32.A

## 2012-11-16 MED ORDER — ESCITALOPRAM OXALATE 10 MG PO TABS: 10.0000 mg | ORAL_TABLET | Freq: Every day | ORAL | Status: DC

## 2012-11-16 MED ORDER — ATORVASTATIN CALCIUM 10 MG PO TABS: 10.0000 mg | ORAL_TABLET | Freq: Every day | ORAL | Status: DC

## 2012-11-16 MED ORDER — ALPRAZOLAM 0.25 MG PO TABS: 0.2500 mg | ORAL_TABLET | Freq: Two times a day (BID) | ORAL | Status: DC | PRN

## 2012-11-16 NOTE — Assessment & Plan Note (Signed)
Pap today, reviewed fasting labs, encouraged heart healthy diet, 8 hours of sleep and regular exercise

## 2012-11-16 NOTE — Assessment & Plan Note (Signed)
Under a great deal of stress with elderly parents. Her father struggling with dementia and her folks have just moved in with her sister. She also is a very stressful job. After discussion the decision was made to start on Lexapro 10 mg tablet one half tablet daily to start and increase up to 1 tablet as tolerated. Also given alprazolam to use sparingly for any acute anxiety or insomnia

## 2012-11-16 NOTE — Assessment & Plan Note (Signed)
Well-controlled on current meds 

## 2012-11-16 NOTE — Assessment & Plan Note (Signed)
Has an appt with Dr Clifton James next month, ordered surveillance Ultrasound today so it can be reviewed at visit.

## 2012-11-16 NOTE — Assessment & Plan Note (Addendum)
Pap today, no concerns per patient or on exam

## 2012-11-16 NOTE — Progress Notes (Signed)
Patient ID: Karen Pierce, female   DOB: 19-Dec-1950, 62 y.o.   MRN: [id number] Karen Pierce [id number] [birth year] 11/16/2012      Progress Note-Follow Up  Subjective  Chief Complaint  Chief Complaint  Patient presents with  . Gynecologic Exam    HPI  Patient is a 62 year old Caucasian female who is in today for annual GYN exam. Recently she's feeling well. No recent physical illness, chest pain, shortness of breath, GI or GU complaints. Her biggest concern today is stress and anxiety. She has a very stressful job. She is also dealing with elderly ailing parents. Her father has dementia and they've just moved in with her sister. She is having to help out she finds herself worrying excessively. She technologist feeling somewhat shaky and irritable at times denies suicidal ideation but has frequent episodes of feeling overwhelmed. Heartburn is adequately controlled and no recent kidney stones  Past Medical History  Diagnosis Date  . Hiatal hernia   . Breast cyst     dense  . Chicken pox as a kid  . Hyperlipidemia   . GERD (gastroesophageal reflux disease)   . Renal artery stenosis     kidney stone  . Colon polyp   . Preventative health care 10/04/2012  . Other and unspecified hyperlipidemia 10/04/2012  . Kidney stone 10/04/2012  . History of shingles 10/04/2012  . Anxiety and depression 11/16/2012    Past Surgical History  Procedure Laterality Date  . Polpypectomy  08/2004  . Cyst removed off left side  1968    benign, skin    Family History  Problem Relation Age of Onset  . Arthritis Mother   . Heart disease Mother   . Cancer Mother 51    breast, sarcoma x 3 bouts in past, under righ arm  . Hyperlipidemia Mother   . Ulcers Father   . Hypertension Father   . Arthritis Father   . Heart disease Maternal Grandmother   . Heart disease Paternal Grandmother   . Heart disease Paternal Grandfather   . Ulcers Paternal Grandfather   . Cancer Brother     pancreatic  . Heart  disease Maternal Grandfather   . Heart attack Maternal Grandfather   . Pneumonia Sister   . Arthritis Sister     rheumatoid    History   Social History  . Marital Status: Single    Spouse Name: N/A    Number of Children: N/A  . Years of Education: N/A   Occupational History  . Not on file.   Social History Main Topics  . Smoking status: Former Smoker -- 0.50 packs/day for 10 years    Types: Cigarettes    Start date: 04/18/1988  . Smokeless tobacco: Never Used  . Alcohol Use: Yes     Comment: occasionally  . Drug Use: No  . Sexually Active: Yes -- Female partner(s)   Other Topics Concern  . Not on file   Social History Narrative  . No narrative on file    Current Outpatient Prescriptions on File Prior to Visit  Medication Sig Dispense Refill  . aspirin 81 MG EC tablet Take 81 mg by mouth daily.        . calcium carbonate (TUMS - DOSED IN MG ELEMENTAL CALCIUM) 500 MG chewable tablet Chew 1 tablet by mouth daily.      . Cholecalciferol (VITAMIN D3) 5000 UNITS CAPS Take 1 capsule by mouth daily.      . Multiple Vitamins-Minerals (MULTIVITAMIN WITH MINERALS)  tablet Take 1 tablet by mouth daily.         No current facility-administered medications on file prior to visit.    No Known Allergies  Review of Systems  Review of Systems  Constitutional: Negative for fever, chills and malaise/fatigue.  HENT: Negative for hearing loss, nosebleeds and congestion.   Eyes: Negative for discharge.  Respiratory: Negative for cough, sputum production, shortness of breath and wheezing.   Cardiovascular: Negative for chest pain, palpitations and leg swelling.  Gastrointestinal: Negative for heartburn, nausea, vomiting, abdominal pain, diarrhea, constipation and blood in stool.  Genitourinary: Negative for dysuria, urgency, frequency and hematuria.  Musculoskeletal: Negative for myalgias, back pain and falls.  Skin: Negative for rash.  Neurological: Negative for dizziness, tremors,  sensory change, focal weakness, loss of consciousness, weakness and headaches.  Endo/Heme/Allergies: Negative for polydipsia. Does not bruise/bleed easily.  Psychiatric/Behavioral: Positive for depression. Negative for suicidal ideas. The patient is nervous/anxious. The patient does not have insomnia.     Objective  BP 112/62  Pulse 62  Temp(Src) 98.6 F (37 C) (Oral)  Wt 172 lb 6.4 oz (78.2 kg)  BMI 27 kg/m2  SpO2 97%  Physical Exam  Physical Exam  Constitutional: She is oriented to person, place, and time and well-developed, well-nourished, and in no distress. No distress.  HENT:  Head: Normocephalic and atraumatic.  Right Ear: External ear normal.  Left Ear: External ear normal.  Nose: Nose normal.  Mouth/Throat: Oropharynx is clear and moist. No oropharyngeal exudate.  Eyes: Conjunctivae are normal. Pupils are equal, round, and reactive to light. Right eye exhibits no discharge. Left eye exhibits no discharge. No scleral icterus.  Neck: Normal range of motion. Neck supple. No thyromegaly present.  Cardiovascular: Normal rate, regular rhythm, normal heart sounds and intact distal pulses.   No murmur heard. Pulmonary/Chest: Effort normal and breath sounds normal. No respiratory distress. She has no wheezes. She has no rales.  Abdominal: Soft. Bowel sounds are normal. She exhibits no distension and no mass. There is no tenderness.  Genitourinary: Vagina normal, uterus normal, cervix normal, right adnexa normal and left adnexa normal. No vaginal discharge found.  Breast exam, no lesions, discharge or skin changes b/l  Musculoskeletal: Normal range of motion. She exhibits no edema and no tenderness.  Lymphadenopathy:    She has no cervical adenopathy.  Neurological: She is alert and oriented to person, place, and time. She has normal reflexes. No cranial nerve deficit. Coordination normal.  Skin: Skin is warm and dry. No rash noted. She is not diaphoretic.  Psychiatric: Mood,  memory and affect normal.    Lab Results  Component Value Date   TSH 1.421 11/07/2012   Lab Results  Component Value Date   WBC 7.1 11/07/2012   HGB 13.9 11/07/2012   HCT 41.7 11/07/2012   MCV 90.7 11/07/2012   PLT 340 11/07/2012   Lab Results  Component Value Date   CREATININE 0.85 11/07/2012   BUN 18 11/07/2012   NA 141 11/07/2012   K 4.7 11/07/2012   CL 105 11/07/2012   CO2 28 11/07/2012   Lab Results  Component Value Date   ALT 12 11/07/2012   AST 16 11/07/2012   ALKPHOS 58 11/07/2012   BILITOT 0.4 11/07/2012   Lab Results  Component Value Date   CHOL 204* 11/07/2012   Lab Results  Component Value Date   HDL 66 11/07/2012   Lab Results  Component Value Date   LDLCALC 124* 11/07/2012   Lab  Results  Component Value Date   TRIG 69 11/07/2012   Lab Results  Component Value Date   CHOLHDL 3.1 11/07/2012     Assessment & Plan  Cervical cancer screening Pap today, no concerns per patient or on exam  RENAL ARTERY STENOSIS Has an appt with Dr Clifton James next month, ordered surveillance Ultrasound today so it can be reviewed at visit.  GERD (gastroesophageal reflux disease) Well controlled on current meds.  Other and unspecified hyperlipidemia Mild elevation, add Atorvastatin back at 10 mg daily, continue krill oil, avoid trans fats.  Anxiety and depression Under a great deal of stress with elderly parents. Her father struggling with dementia and her folks have just moved in with her sister. She also is a very stressful job. After discussion the decision was made to start on Lexapro 10 mg tablet one half tablet daily to start and increase up to 1 tablet as tolerated. Also given alprazolam to use sparingly for any acute anxiety or insomnia  Preventative health care Pap today, reviewed fasting labs, encouraged heart healthy diet, 8 hours of sleep and regular exercise

## 2012-11-16 NOTE — Patient Instructions (Addendum)

## 2012-11-16 NOTE — Assessment & Plan Note (Addendum)
Mild elevation, add Atorvastatin back at 10 mg daily, continue krill oil, avoid trans fats.

## 2012-11-21 NOTE — Progress Notes (Signed)
Quick Note:  Patient Informed and voiced understanding ______ 

## 2012-11-22 ENCOUNTER — Encounter (INDEPENDENT_AMBULATORY_CARE_PROVIDER_SITE_OTHER): Payer: PRIVATE HEALTH INSURANCE

## 2012-11-22 DIAGNOSIS — I701 Atherosclerosis of renal artery: Secondary | ICD-10-CM

## 2012-11-28 NOTE — Progress Notes (Signed)
Notify stable Renal artery stenosis on right, no concerns on left. No changes from 2012

## 2012-12-20 ENCOUNTER — Encounter: Payer: Self-pay | Admitting: Cardiovascular Disease

## 2012-12-20 ENCOUNTER — Ambulatory Visit (INDEPENDENT_AMBULATORY_CARE_PROVIDER_SITE_OTHER): Payer: PRIVATE HEALTH INSURANCE | Admitting: Cardiovascular Disease

## 2012-12-20 VITALS — BP 116/78 | HR 63 | Ht 67.0 in | Wt 173.0 lb

## 2012-12-20 DIAGNOSIS — I701 Atherosclerosis of renal artery: Secondary | ICD-10-CM

## 2012-12-20 DIAGNOSIS — R079 Chest pain, unspecified: Secondary | ICD-10-CM

## 2012-12-20 DIAGNOSIS — I739 Peripheral vascular disease, unspecified: Secondary | ICD-10-CM

## 2012-12-20 NOTE — Progress Notes (Signed)
History of Present Illness: 62 yo WF with history of hyperlipidemia and renal artery stenosis with normal BP and normal renal function here today for PV follow up. She has been followed in the past by Dr. Allyson Sabal from [facility]. I last saw her in October 2011. Most recent dopplers in August 2014 showed 50% right renal artery stenosis and no evidence of Left renal artery stenosis.  Her ABIs were normal on the right and left in December 2013. She has had a normal stress Myoview in the past.  She is here for follow up. She has no leg pain with walking. BP has been controlled. She does describe an episode of severe bilateral arm pain, dizziness, sweating last week. She also describes some exertional dyspnea.   Primary Care Physician: Abner Greenspan  Last Lipid Profile:Lipid Panel     Component Value Date/Time   CHOL 204* 11/07/2012 1314   TRIG 69 11/07/2012 1314   HDL 66 11/07/2012 1314   CHOLHDL 3.1 11/07/2012 1314   VLDL 14 11/07/2012 1314   LDLCALC 124* 11/07/2012 1314     Past Medical History  Diagnosis Date  . Hiatal hernia   . Breast cyst     dense  . Chicken pox as a kid  . Hyperlipidemia   . GERD (gastroesophageal reflux disease)   . Renal artery stenosis     kidney stone  . Colon polyp   . Preventative health care 10/04/2012  . Other and unspecified hyperlipidemia 10/04/2012  . Kidney stone 10/04/2012  . History of shingles 10/04/2012  . Anxiety and depression 11/16/2012    Past Surgical History  Procedure Laterality Date  . Polpypectomy  08/2004  . Cyst removed off left side  1968    benign, skin    Current Outpatient Prescriptions  Medication Sig Dispense Refill  . ALPRAZolam (XANAX) 0.25 MG tablet Take 1 tablet (0.25 mg total) by mouth 2 (two) times daily as needed for sleep or anxiety.  20 tablet  1  . aspirin 81 MG EC tablet Take 81 mg by mouth daily.        Marland Kitchen atorvastatin (LIPITOR) 10 MG tablet Take 1 tablet (10 mg total) by mouth daily.  30 tablet  5  . calcium  carbonate (TUMS - DOSED IN MG ELEMENTAL CALCIUM) 500 MG chewable tablet Chew 1 tablet by mouth daily.      . Cholecalciferol (VITAMIN D3) 5000 UNITS CAPS Take 1 capsule by mouth daily.      Marland Kitchen escitalopram (LEXAPRO) 10 MG tablet Take 10 mg by mouth daily. PRN      . Krill Oil CAPS Take by mouth.      . Multiple Vitamins-Minerals (MULTIVITAMIN WITH MINERALS) tablet Take 1 tablet by mouth daily.         No current facility-administered medications for this visit.    No Known Allergies  History   Social History  . Marital Status: Single    Spouse Name: N/A    Number of Children: N/A  . Years of Education: N/A   Occupational History  . Not on file.   Social History Main Topics  . Smoking status: Former Smoker -- 0.50 packs/day for 10 years    Types: Cigarettes    Start date: 04/18/1988  . Smokeless tobacco: Never Used  . Alcohol Use: Yes     Comment: occasionally  . Drug Use: No  . Sexual Activity: Yes    Partners: Male   Other Topics Concern  . Not on  file   Social History Narrative  . No narrative on file    Family History  Problem Relation Age of Onset  . Arthritis Mother   . Heart disease Mother   . Cancer Mother 84    breast, sarcoma x 3 bouts in past, under righ arm  . Hyperlipidemia Mother   . Ulcers Father   . Hypertension Father   . Arthritis Father   . Heart disease Maternal Grandmother   . Heart disease Paternal Grandmother   . Heart disease Paternal Grandfather   . Ulcers Paternal Grandfather   . Cancer Brother     pancreatic  . Heart disease Maternal Grandfather   . Heart attack Maternal Grandfather   . Pneumonia Sister   . Arthritis Sister     rheumatoid    Review of Systems:  As stated in the HPI and otherwise negative.   BP 116/78  Pulse 63  Ht 5\' 7"  (1.702 m)  Wt 173 lb (78.472 kg)  BMI 27.09 kg/m2  Physical Examination: General: Well developed, well nourished, NAD HEENT: OP clear, mucus membranes moist SKIN: warm, dry. No  rashes. Neuro: No focal deficits Musculoskeletal: Muscle strength 5/5 all ext Psychiatric: Mood and affect normal Neck: No JVD, no carotid bruits, no thyromegaly, no lymphadenopathy. Lungs:Clear bilaterally, no wheezes, rhonci, crackles Cardiovascular: Regular rate and rhythm. No murmurs, gallops or rubs. Abdomen:Soft. Bowel sounds present. Non-tender.  Extremities: No lower extremity edema. Pulses are 2 + in the bilateral DP/PT.  EKG: NSR, rate 61 bpm.   Renal artery dopplers 11/22/12: Normal caliber aorta. Stable 1-59% right renal artery stenosis. Normal left renal artery. Normal bilateral kidney size.   Assessment and Plan:   1. PAD: Right renal artery stenosis stable by doppler in August 2014. Right kidney size is stable.  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 Will repeat renal artery dopplers and ABI in one year.   2. Chest pain: Risk factors for CAD include known PAD and HLD. Will arrange exercise stress myoview to exclude ischemia.

## 2012-12-20 NOTE — Patient Instructions (Signed)
Your physician wants you to follow-up in:  12 months. You will receive a reminder letter in the mail two months in advance. If you don't receive a letter, please call our office to schedule the follow-up appointment.   Your physician has requested that you have an exercise stress myoview. For further information please visit [email or web address] Please follow instruction sheet, as given.  Your physician has requested that you have a renal artery duplex. During this test, an ultrasound is used to evaluate blood flow to the kidneys. Allow one hour for this exam. Do not eat after midnight the day before and avoid carbonated beverages. Take your medications as you usually do. To be done in 12 months.   Your physician has requested that you have a lower or upper extremity arterial duplex. This test is an ultrasound of the arteries in the legs or arms. It looks at arterial blood flow in the legs and arms. Allow one hour for Lower and Upper Arterial scans. There are no restrictions or special instructions. To be done in 12 months.

## 2012-12-31 ENCOUNTER — Ambulatory Visit (INDEPENDENT_AMBULATORY_CARE_PROVIDER_SITE_OTHER): Payer: PRIVATE HEALTH INSURANCE | Admitting: Physician Assistant

## 2012-12-31 ENCOUNTER — Encounter: Payer: Self-pay | Admitting: Physician Assistant

## 2012-12-31 ENCOUNTER — Encounter (HOSPITAL_COMMUNITY): Payer: PRIVATE HEALTH INSURANCE

## 2012-12-31 VITALS — BP 102/62 | HR 69 | Temp 98.5°F | Resp 16 | Ht 67.0 in | Wt 175.5 lb

## 2012-12-31 DIAGNOSIS — J069 Acute upper respiratory infection, unspecified: Secondary | ICD-10-CM | POA: Insufficient documentation

## 2012-12-31 MED ORDER — HYDROCOD POLST-CHLORPHEN POLST 10-8 MG/5ML PO LQCR: 5.0000 mL | Freq: Two times a day (BID) | ORAL | Status: DC | PRN

## 2012-12-31 NOTE — Progress Notes (Signed)
Patient ID: Karen Pierce, female   DOB: 1950/09/10, 62 y.o.   MRN: [id number]  Patient presents to clinic today c/o hoarseness, productive cough of yellowish sputum and nasal congestion for the past 3-4 days.  Patient denies headache, fever, chills.  Endorses slight post-nasal drip and irritated throat.  Denies shortness of breath or wheezing.  Denies history of allergy or asthma. Denies abdominal pain, N/V/D, rash.  Denies recent sick contact.  No other complaints.   Past Medical History  Diagnosis Date  . Hiatal hernia   . Breast cyst     dense  . Chicken pox as a kid  . Hyperlipidemia   . GERD (gastroesophageal reflux disease)   . Renal artery stenosis     kidney stone  . Colon polyp   . Preventative health care 10/04/2012  . Other and unspecified hyperlipidemia 10/04/2012  . Kidney stone 10/04/2012  . History of shingles 10/04/2012  . Anxiety and depression 11/16/2012    Current Outpatient Prescriptions on File Prior to Visit  Medication Sig Dispense Refill  . ALPRAZolam (XANAX) 0.25 MG tablet Take 1 tablet (0.25 mg total) by mouth 2 (two) times daily as needed for sleep or anxiety.  20 tablet  1  . aspirin 81 MG EC tablet Take 81 mg by mouth daily.        Marland Kitchen atorvastatin (LIPITOR) 10 MG tablet Take 1 tablet (10 mg total) by mouth daily.  30 tablet  5  . calcium carbonate (TUMS - DOSED IN MG ELEMENTAL CALCIUM) 500 MG chewable tablet Chew 1 tablet by mouth daily.      . Cholecalciferol (VITAMIN D3) 5000 UNITS CAPS Take 1 capsule by mouth daily.      Marland Kitchen escitalopram (LEXAPRO) 10 MG tablet Take 10 mg by mouth daily. PRN      . Krill Oil CAPS Take by mouth.      . Multiple Vitamins-Minerals (MULTIVITAMIN WITH MINERALS) tablet Take 1 tablet by mouth daily.         No current facility-administered medications on file prior to visit.    No Known Allergies  Family History  Problem Relation Age of Onset  . Arthritis Mother   . Heart disease Mother   . Cancer Mother 25    breast, sarcoma  x 3 bouts in past, under righ arm  . Hyperlipidemia Mother   . Ulcers Father   . Hypertension Father   . Arthritis Father   . Heart disease Maternal Grandmother   . Heart disease Paternal Grandmother   . Heart disease Paternal Grandfather   . Ulcers Paternal Grandfather   . Cancer Brother     pancreatic  . Heart disease Maternal Grandfather   . Heart attack Maternal Grandfather   . Pneumonia Sister   . Arthritis Sister     rheumatoid    History   Social History  . Marital Status: Single    Spouse Name: N/A    Number of Children: N/A  . Years of Education: N/A   Social History Main Topics  . Smoking status: Former Smoker -- 0.50 packs/day for 10 years    Types: Cigarettes    Start date: 04/18/1988  . Smokeless tobacco: Never Used  . Alcohol Use: Yes     Comment: occasionally  . Drug Use: No  . Sexual Activity: Yes    Partners: Male   Other Topics Concern  . None   Social History Narrative  . None   Review of Systems  Constitutional:  Negative for fever, chills, malaise/fatigue and diaphoresis.  HENT: Positive for congestion. Negative for ear pain, tinnitus and ear discharge.   Respiratory: Positive for cough and sputum production. Negative for hemoptysis, shortness of breath and wheezing.   Gastrointestinal: Negative for nausea, vomiting, abdominal pain, diarrhea and constipation.  Musculoskeletal: Negative for myalgias.  Skin: Negative for rash.  Neurological: Negative for headaches.  Endo/Heme/Allergies: Negative for environmental allergies.   Filed Vitals:   12/31/12 1112  BP: 102/62  Pulse: 69  Temp: 98.5 F (36.9 C)  Resp: 16    Physical Exam  Vitals reviewed. Constitutional: She is oriented to person, place, and time and well-developed, well-nourished, and in no distress.  HENT:  Head: Normocephalic and atraumatic.  Right Ear: External ear normal.  Left Ear: External ear normal.  Nose: Nose normal.  Mouth/Throat: Oropharynx is clear and moist.  No oropharyngeal exudate.  TM WNL bilaterally  Eyes: Conjunctivae are normal.  Neck: Neck supple.  Cardiovascular: Normal rate, regular rhythm, normal heart sounds and intact distal pulses.   Pulmonary/Chest: Effort normal and breath sounds normal. No respiratory distress. She has no wheezes. She has no rales. She exhibits no tenderness.  Lymphadenopathy:    She has no cervical adenopathy.  Neurological: She is alert and oriented to person, place, and time.  Skin: Skin is warm and dry. No rash noted.     Recent Results (from the past 2160 hour(s))  LIPID PANEL     Status: Abnormal   Collection Time    11/07/12  1:14 PM      Result Value Range   Cholesterol 204 (*) 0 - 200 mg/dL   Comment: ATP III Classification:           < 200        mg/dL        Desirable          200 - 239     mg/dL        Borderline High          >= 240        mg/dL        High         Triglycerides 69  <150 mg/dL   HDL 66  >86 mg/dL   Total CHOL/HDL Ratio 3.1     VLDL 14  0 - 40 mg/dL   LDL Cholesterol 578 (*) 0 - 99 mg/dL   Comment:       Total Cholesterol/HDL Ratio:CHD Risk                            Coronary Heart Disease Risk Table                                            Men       Women              1/2 Average Risk              3.4        3.3                  Average Risk              5.0        4.4  2X Average Risk              9.6        7.1               3X Average Risk             23.4       11.0     Use the calculated Patient Ratio above and the CHD Risk table      to determine the patient's CHD Risk.     ATP III Classification (LDL):           < 100        mg/dL         Optimal          100 - 129     mg/dL         Near or Above Optimal          130 - 159     mg/dL         Borderline High          160 - 189     mg/dL         High           > 190        mg/dL         Very High        RENAL FUNCTION PANEL     Status: None   Collection Time    11/07/12  1:14 PM      Result  Value Range   Sodium 141  135 - 145 mEq/L   Potassium 4.7  3.5 - 5.3 mEq/L   Chloride 105  96 - 112 mEq/L   CO2 28  19 - 32 mEq/L   Glucose, Bld 80  70 - 99 mg/dL   BUN 18  6 - 23 mg/dL   Creat 0.98  1.19 - 1.47 mg/dL   Albumin 4.3  3.5 - 5.2 g/dL   Calcium 9.4  8.4 - 82.9 mg/dL   Phosphorus 4.5  2.3 - 4.6 mg/dL  CBC     Status: None   Collection Time    11/07/12  1:14 PM      Result Value Range   WBC 7.1  4.0 - 10.5 K/uL   RBC 4.60  3.87 - 5.11 MIL/uL   Hemoglobin 13.9  12.0 - 15.0 g/dL   HCT 56.2  13.0 - 86.5 %   MCV 90.7  78.0 - 100.0 fL   MCH 30.2  26.0 - 34.0 pg   MCHC 33.3  30.0 - 36.0 g/dL   RDW 78.4  69.6 - 29.5 %   Platelets 340  150 - 400 K/uL  TSH     Status: None   Collection Time    11/07/12  1:14 PM      Result Value Range   TSH 1.421  0.350 - 4.500 uIU/mL  HEPATIC FUNCTION PANEL     Status: None   Collection Time    11/07/12  1:14 PM      Result Value Range   Total Bilirubin 0.4  0.3 - 1.2 mg/dL   Bilirubin, Direct 0.1  0.0 - 0.3 mg/dL   Indirect Bilirubin 0.3  0.0 - 0.9 mg/dL   Alkaline Phosphatase 58  39 - 117 U/L   AST 16  0 - 37 U/L   ALT 12  0 - 35 U/L   Total Protein 6.4  6.0 - 8.3 g/dL   Albumin 4.3  3.5 - 5.2 g/dL  HEMOGLOBIN Z6X     Status: None   Collection Time    11/07/12  1:14 PM      Result Value Range   Hemoglobin A1C 5.3  <5.7 %   Comment:                                                                            According to the ADA Clinical Practice Recommendations for 2011, when     HbA1c is used as a screening test:             >=6.5%   Diagnostic of Diabetes Mellitus                (if abnormal result is confirmed)           5.7-6.4%   Increased risk of developing Diabetes Mellitus           References:Diagnosis and Classification of Diabetes Mellitus,Diabetes     Care,2011,34(Suppl 1):S62-S69 and Standards of Medical Care in             Diabetes - 2011,Diabetes Care,2011,34 (Suppl 1):S11-S61.         Mean Plasma Glucose  105  <117 mg/dL   Assessment/Plan: Viral URI with cough Given duration of symptoms and normal physical exam, illness is most likely viral. Rest. Fluids. Saline nasal spray. Rx Tussionex. Daily zyrtec. Mucinex.  Voice rest.  Humidifier.  Return if symptoms persist or worsen.

## 2012-12-31 NOTE — Assessment & Plan Note (Signed)
Given duration of symptoms and normal physical exam, illness is most likely viral. Rest. Fluids. Saline nasal spray. Rx Tussionex. Daily zyrtec. Mucinex.  Voice rest.  Humidifier.  Return if symptoms persist or worsen.

## 2012-12-31 NOTE — Patient Instructions (Signed)
Please drink plenty of fluids and get plenty of rest.  Take a daily calritin or zyrtec.  Saline nasal spray.  Daily probiotic.  Use cough syrup every 12 hours as needed for pain.  Please call or return if symptoms are not improving.  Viral Infections A viral infection can be caused by different types of viruses.Most viral infections are not serious and resolve on their own. However, some infections may cause severe symptoms and may lead to further complications. SYMPTOMS Viruses can frequently cause:  Minor sore throat.  Aches and pains.  Headaches.  Runny nose.  Different types of rashes.  Watery eyes.  Tiredness.  Cough.  Loss of appetite.  Gastrointestinal infections, resulting in nausea, vomiting, and diarrhea. These symptoms do not respond to antibiotics because the infection is not caused by bacteria. However, you might catch a bacterial infection following the viral infection. This is sometimes called a "superinfection." Symptoms of such a bacterial infection may include:  Worsening sore throat with pus and difficulty swallowing.  Swollen neck glands.  Chills and a high or persistent fever.  Severe headache.  Tenderness over the sinuses.  Persistent overall ill feeling (malaise), muscle aches, and tiredness (fatigue).  Persistent cough.  Yellow, green, or brown mucus production with coughing. HOME CARE INSTRUCTIONS   Only take over-the-counter or prescription medicines for pain, discomfort, diarrhea, or fever as directed by your caregiver.  Drink enough water and fluids to keep your urine clear or pale yellow. Sports drinks can provide valuable electrolytes, sugars, and hydration.  Get plenty of rest and maintain proper nutrition. Soups and broths with crackers or rice are fine. SEEK IMMEDIATE MEDICAL CARE IF:   You have severe headaches, shortness of breath, chest pain, neck pain, or an unusual rash.  You have uncontrolled vomiting, diarrhea, or you are  unable to keep down fluids.  You or your child has an oral temperature above 102 F (38.9 C), not controlled by medicine.  Your baby is older than 3 months with a rectal temperature of 102 F (38.9 C) or higher.  Your baby is 14 months old or younger with a rectal temperature of 100.4 F (38 C) or higher. MAKE SURE YOU:   Understand these instructions.  Will watch your condition.  Will get help right away if you are not doing well or get worse. Document Released: 01/12/2005 Document Revised: 06/27/2011 Document Reviewed: 08/09/2010 [facility] Patient Information 2014 [place], [place].

## 2013-01-01 ENCOUNTER — Encounter (HOSPITAL_COMMUNITY): Payer: PRIVATE HEALTH INSURANCE

## 2013-01-01 ENCOUNTER — Telehealth: Payer: Self-pay

## 2013-01-01 NOTE — Telephone Encounter (Signed)
Patient left a message on my vm stating that Karen Pierce told her yesterday that if she developed a fever to call the office.   Pt stated in message that she does have a fever.  Please advise?

## 2013-01-01 NOTE — Telephone Encounter (Signed)
FYI: LMOM with contact name and number for return call RE: what grade of fever she is running and/or any further new symptom information per provider/SLS

## 2013-01-01 NOTE — Telephone Encounter (Addendum)
Spoke with patient concerning fever.  Fever is controlled with tylenol.  Patient advised that with viral illnesses, fever is usually worse at night.  Given history and physical exam , I still feel this illness is viral in nature.  If fever persists despite medicine or if symptoms are not improving in the next few days, I will gladly send in rx for antibiotic.

## 2013-01-01 NOTE — Telephone Encounter (Signed)
Patient called stating that she missed Sharon's call. She stated her Temperature was 101 and after tylenol 99.

## 2013-01-01 NOTE — Telephone Encounter (Signed)
Please Advise/SLS  

## 2013-01-02 ENCOUNTER — Telehealth: Payer: Self-pay

## 2013-01-02 DIAGNOSIS — J209 Acute bronchitis, unspecified: Secondary | ICD-10-CM

## 2013-01-02 MED ORDER — DOXYCYCLINE HYCLATE 100 MG PO TABS: 100.0000 mg | ORAL_TABLET | Freq: Two times a day (BID) | ORAL | Status: DC

## 2013-01-02 NOTE — Telephone Encounter (Signed)
Patient informed, understood & agreed/SLS  

## 2013-01-02 NOTE — Telephone Encounter (Signed)
Rx Doxycycline BID x 10 days sent to [facility].  Please remind patient that cough can persist for 1-2 weeks after other symptoms have dissipated.

## 2013-01-02 NOTE — Telephone Encounter (Signed)
Patient left a message stating that she still has a low grade fever and would like Cody to send in a antibiotic for her?  Please advise?

## 2013-01-14 ENCOUNTER — Ambulatory Visit (HOSPITAL_COMMUNITY): Payer: PRIVATE HEALTH INSURANCE | Attending: Cardiology | Admitting: Radiology

## 2013-01-14 VITALS — BP 108/73 | Ht 67.0 in | Wt 170.0 lb

## 2013-01-14 DIAGNOSIS — E785 Hyperlipidemia, unspecified: Secondary | ICD-10-CM | POA: Insufficient documentation

## 2013-01-14 DIAGNOSIS — R002 Palpitations: Secondary | ICD-10-CM | POA: Insufficient documentation

## 2013-01-14 DIAGNOSIS — Z87891 Personal history of nicotine dependence: Secondary | ICD-10-CM | POA: Insufficient documentation

## 2013-01-14 DIAGNOSIS — Z8249 Family history of ischemic heart disease and other diseases of the circulatory system: Secondary | ICD-10-CM | POA: Insufficient documentation

## 2013-01-14 DIAGNOSIS — I491 Atrial premature depolarization: Secondary | ICD-10-CM

## 2013-01-14 DIAGNOSIS — R5381 Other malaise: Secondary | ICD-10-CM | POA: Insufficient documentation

## 2013-01-14 DIAGNOSIS — R0989 Other specified symptoms and signs involving the circulatory and respiratory systems: Secondary | ICD-10-CM | POA: Insufficient documentation

## 2013-01-14 DIAGNOSIS — R079 Chest pain, unspecified: Secondary | ICD-10-CM

## 2013-01-14 DIAGNOSIS — R42 Dizziness and giddiness: Secondary | ICD-10-CM | POA: Insufficient documentation

## 2013-01-14 DIAGNOSIS — R61 Generalized hyperhidrosis: Secondary | ICD-10-CM | POA: Insufficient documentation

## 2013-01-14 DIAGNOSIS — R0609 Other forms of dyspnea: Secondary | ICD-10-CM | POA: Insufficient documentation

## 2013-01-14 DIAGNOSIS — I739 Peripheral vascular disease, unspecified: Secondary | ICD-10-CM | POA: Insufficient documentation

## 2013-01-14 MED ORDER — TECHNETIUM TC 99M SESTAMIBI GENERIC - CARDIOLITE
30.0000 | Freq: Once | INTRAVENOUS | Status: AC | PRN
  Administered 2013-01-14: 30 via INTRAVENOUS

## 2013-01-14 MED ORDER — TECHNETIUM TC 99M SESTAMIBI GENERIC - CARDIOLITE
10.0000 | Freq: Once | INTRAVENOUS | Status: AC | PRN
  Administered 2013-01-14: 10 via INTRAVENOUS

## 2013-01-14 NOTE — Progress Notes (Signed)
[facility] SITE 3 NUCLEAR MED [address] [place], [place] [address] [phone]    Cardiology Nuclear Med Study  VERYL ABRIL is a 62 y.o. female     MRN : [id number]     DOB: [birth year]  Procedure Date: 01/14/2013  Nuclear Med Background Indication for Stress Test:  Evaluation for Ischemia History: No prior known history of CAD, and > 5 yrs ago Myocardial Perfusion Study-Normal,[facility] Cardiac Risk Factors: Family History - CAD, History of Smoking, Lipids and PVD  Symptoms:  Diaphoresis, Dizziness, DOE, Fatigue and Palpitations   Nuclear Pre-Procedure Caffeine/Decaff Intake:  None > 12 hrs NPO After: 6:00pm   Lungs:  clear O2 Sat: 98% on room air. IV 0.9% NS with Angio Cath:  20g  IV Site: R Antecubital x 1, tolerated well IV Started by:  Irean Hong, RN  Chest Size (in):  38 Cup Size: C  Height: 5\' 7"  (1.702 m)  Weight:  170 lb (77.111 kg)  BMI:  Body mass index is 26.62 kg/(m^2). Tech Comments:  n/a    Nuclear Med Study 1 or 2 day study: 1 day  Stress Test Type:  Stress  Reading MD: Olga Millers, MD  Order Authorizing Provider:  Verne Carrow, MD  Resting Radionuclide: Technetium 61m Sestamibi  Resting Radionuclide Dose: 11.0 mCi   Stress Radionuclide:  Technetium 61m Sestamibi  Stress Radionuclide Dose: 33.0 mCi           Stress Protocol Rest HR: 57 Stress HR: 150  Rest BP: 108/73 Stress BP: 205/63  Exercise Time (min): 6:45 METS: 8.10   Predicted Max HR: 160 bpm % Max HR: 93.75 bpm Rate Pressure Product: 25956   Dose of Adenosine (mg):  n/a Dose of Lexiscan: n/a mg  Dose of Atropine (mg): n/a Dose of Dobutamine: n/a mcg/kg/min (at max HR)  Stress Test Technologist: Milana Na, EMT-P  Nuclear Technologist:  Domenic Polite, CNMT     Rest Procedure:  Myocardial perfusion imaging was performed at rest 45 minutes following the intravenous administration of Technetium 67m Sestamibi. Rest ECG: NSR - Normal EKG  Stress Procedure:   The patient exercised on the treadmill utilizing the Bruce Protocol for 6:45 minutes. The patient stopped due to fatigue and denied any chest pain.  Technetium 78m Sestamibi was injected at peak exercise and myocardial perfusion imaging was performed after a brief delay. Stress ECG: No significant change from baseline ECG  QPS Raw Data Images:  Normal; no motion artifact; normal heart/lung ratio. Stress Images:  Decrease uptake in inferior apex Rest Images:  Decrease uptake in inferior apex Subtraction (SDS):  There is a fixed defect that is most consistent with a previous infarction. Transient Ischemic Dilatation (Normal <1.22):  n/a Lung/Heart Ratio (Normal <0.45):  0.38  Quantitative Gated Spect Images QGS EDV:  95 ml QGS ESV:  41 ml  Impression Exercise Capacity:  Fair exercise capacity. BP Response:  Normal blood pressure response. Clinical Symptoms:  No significant symptoms noted. ECG Impression:  No significant ST segment change suggestive of ischemia. Comparison with Prior Nuclear Study: No images to compare  Overall Impression:  Low risk stress nuclear study Possible small inferoapical wall infarct with no ischemia Normal ETT portion of test .  LV Ejection Fraction: 57%.  LV Wall Motion:  Normal Wall Motion   Charlton Haws

## 2013-01-16 ENCOUNTER — Telehealth: Payer: Self-pay | Admitting: Cardiovascular Disease

## 2013-01-16 NOTE — Telephone Encounter (Signed)
Spoke with pt and reviewed stress test results with her.  

## 2013-01-16 NOTE — Telephone Encounter (Signed)
Follow Up:  Pt states she is returning Pat's call and would like to obtain her test results.

## 2013-04-01 ENCOUNTER — Telehealth: Payer: Self-pay

## 2013-04-01 NOTE — Telephone Encounter (Signed)
Patient left a message stating that she would like to know when her last TDAP was because they have a newborn in the family?  I called and informed patient that the last one was done on 07-21-08.

## 2013-04-23 ENCOUNTER — Telehealth: Payer: Self-pay | Admitting: Family Medicine

## 2013-04-23 NOTE — Telephone Encounter (Signed)
Patient states that her mother was diagnosed with the flu last Friday, started taking tamiflu last Sunday. Patient wants to know if her mother would still be contagious?

## 2013-04-23 NOTE — Telephone Encounter (Signed)
Patient informed and voiced understanding

## 2013-04-23 NOTE — Telephone Encounter (Signed)
Please advise 

## 2013-04-23 NOTE — Telephone Encounter (Signed)
Minimally contagiuous most likely but cannot say with 166% certainty. Do not share food, drinks and wash hands frequently

## 2013-06-25 ENCOUNTER — Other Ambulatory Visit: Payer: Self-pay

## 2013-06-25 DIAGNOSIS — E785 Hyperlipidemia, unspecified: Secondary | ICD-10-CM

## 2013-06-25 MED ORDER — ESCITALOPRAM OXALATE 10 MG PO TABS: 10.0000 mg | ORAL_TABLET | Freq: Every day | ORAL | Status: DC

## 2013-06-25 MED ORDER — ATORVASTATIN CALCIUM 10 MG PO TABS: 10.0000 mg | ORAL_TABLET | Freq: Every day | ORAL | Status: DC

## 2013-07-11 ENCOUNTER — Ambulatory Visit (INDEPENDENT_AMBULATORY_CARE_PROVIDER_SITE_OTHER): Payer: PRIVATE HEALTH INSURANCE | Admitting: Family Medicine

## 2013-07-11 ENCOUNTER — Encounter: Payer: Self-pay | Admitting: Family Medicine

## 2013-07-11 VITALS — BP 124/60 | HR 67 | Temp 98.4°F | Ht 67.0 in | Wt 176.0 lb

## 2013-07-11 DIAGNOSIS — F329 Major depressive disorder, single episode, unspecified: Secondary | ICD-10-CM

## 2013-07-11 DIAGNOSIS — F32A Depression, unspecified: Secondary | ICD-10-CM

## 2013-07-11 DIAGNOSIS — F419 Anxiety disorder, unspecified: Secondary | ICD-10-CM

## 2013-07-11 DIAGNOSIS — E785 Hyperlipidemia, unspecified: Secondary | ICD-10-CM

## 2013-07-11 DIAGNOSIS — K219 Gastro-esophageal reflux disease without esophagitis: Secondary | ICD-10-CM

## 2013-07-11 DIAGNOSIS — F341 Dysthymic disorder: Secondary | ICD-10-CM

## 2013-07-11 DIAGNOSIS — I701 Atherosclerosis of renal artery: Secondary | ICD-10-CM

## 2013-07-11 LAB — HEPATIC FUNCTION PANEL
ALK PHOS: 67 U/L (ref 39–117)
ALT: 23 U/L (ref 0–35)
AST: 19 U/L (ref 0–37)
Albumin: 4.2 g/dL (ref 3.5–5.2)
BILIRUBIN DIRECT: 0.1 mg/dL (ref 0.0–0.3)
BILIRUBIN INDIRECT: 0.5 mg/dL (ref 0.2–1.2)
TOTAL PROTEIN: 6.3 g/dL (ref 6.0–8.3)
Total Bilirubin: 0.6 mg/dL (ref 0.2–1.2)

## 2013-07-11 LAB — RENAL FUNCTION PANEL
Albumin: 4.2 g/dL (ref 3.5–5.2)
BUN: 21 mg/dL (ref 6–23)
CHLORIDE: 102 meq/L (ref 96–112)
CO2: 30 mEq/L (ref 19–32)
CREATININE: 0.73 mg/dL (ref 0.50–1.10)
Calcium: 9.3 mg/dL (ref 8.4–10.5)
GLUCOSE: 86 mg/dL (ref 70–99)
PHOSPHORUS: 3.7 mg/dL (ref 2.3–4.6)
Potassium: 4.5 mEq/L (ref 3.5–5.3)
Sodium: 141 mEq/L (ref 135–145)

## 2013-07-11 LAB — LIPID PANEL
CHOL/HDL RATIO: 2.7 ratio
CHOLESTEROL: 165 mg/dL (ref 0–200)
HDL: 61 mg/dL (ref >39)
LDL CALC: 94 mg/dL (ref 0–99)
Triglycerides: 52 mg/dL (ref <150)
VLDL: 10 mg/dL (ref 0–40)

## 2013-07-11 LAB — CBC
HCT: 41.4 % (ref 36.0–46.0)
HEMOGLOBIN: 14 g/dL (ref 12.0–15.0)
MCH: 30.1 pg (ref 26.0–34.0)
MCHC: 33.8 g/dL (ref 30.0–36.0)
MCV: 89 fL (ref 78.0–100.0)
Platelets: 325 10*3/uL (ref 150–400)
RBC: 4.65 MIL/uL (ref 3.87–5.11)
RDW: 13.5 % (ref 11.5–15.5)
WBC: 6.6 10*3/uL (ref 4.0–10.5)

## 2013-07-11 MED ORDER — ESCITALOPRAM OXALATE 10 MG PO TABS: 10.0000 mg | ORAL_TABLET | Freq: Every day | ORAL | Status: DC

## 2013-07-11 MED ORDER — ATORVASTATIN CALCIUM 10 MG PO TABS: 10.0000 mg | ORAL_TABLET | Freq: Every day | ORAL | Status: DC

## 2013-07-11 NOTE — Progress Notes (Signed)
Pre visit review using our clinic review tool, if applicable. No additional management support is needed unless otherwise documented below in the visit note. 

## 2013-07-11 NOTE — Patient Instructions (Addendum)
Try Claritin/Loratadine daily for the PND  Cholesterol Cholesterol is a white, waxy, fat-like protein needed by your body in small amounts. The liver makes all the cholesterol you need. It is carried from the liver by the blood through the blood vessels. Deposits (plaque) may build up on blood vessel walls. This makes the arteries narrower and stiffer. Plaque increases the risk for heart attack and stroke. You cannot feel your cholesterol level even if it is very high. The only way to know is by a blood test to check your lipid (fats) levels. Once you know your cholesterol levels, you should keep a record of the test results. Work with your caregiver to to keep your levels in the desired range. WHAT THE RESULTS MEAN:  Total cholesterol is a rough measure of all the cholesterol in your blood.  LDL is the so-called bad cholesterol. This is the type that deposits cholesterol in the walls of the arteries. You want this level to be low.  HDL is the good cholesterol because it cleans the arteries and carries the LDL away. You want this level to be high.  Triglycerides are fat that the body can either burn for energy or store. High levels are closely linked to heart disease. DESIRED LEVELS:  Total cholesterol below 200.  LDL below 100 for people at risk, below 70 for very high risk.  HDL above 50 is good, above 60 is best.  Triglycerides below 150. HOW TO LOWER YOUR CHOLESTEROL:  Diet.  Choose fish or white meat chicken and [place], roasted or baked. Limit fatty cuts of red meat, fried foods, and processed meats, such as sausage and lunch meat.  Eat lots of fresh fruits and vegetables. Choose whole grains, beans, pasta, potatoes and cereals.  Use only small amounts of olive, corn or canola oils. Avoid butter, mayonnaise, shortening or palm kernel oils. Avoid foods with trans-fats.  Use skim/nonfat milk and low-fat/nonfat yogurt and cheeses. Avoid whole milk, cream, ice cream, egg yolks and  cheeses. Healthy desserts include angel food cake, ginger snaps, animal crackers, hard candy, popsicles, and low-fat/nonfat frozen yogurt. Avoid pastries, cakes, pies and cookies.  Exercise.  A regular program helps decrease LDL and raises HDL.  Helps with weight control.  Do things that increase your activity level like gardening, walking, or taking the stairs.  Medication.  May be prescribed by your caregiver to help lowering cholesterol and the risk for heart disease.  You may need medicine even if your levels are normal if you have several risk factors. HOME CARE INSTRUCTIONS   Follow your diet and exercise programs as suggested by your caregiver.  Take medications as directed.  Have blood work done when your caregiver feels it is necessary. MAKE SURE YOU:   Understand these instructions.  Will watch your condition.  Will get help right away if you are not doing well or get worse. Document Released: 12/28/2000 Document Revised: 06/27/2011 Document Reviewed: 01/16/2013 [facility] Patient Information 2014 [place], [place].

## 2013-07-12 LAB — TSH: TSH: 1.462 u[IU]/mL (ref 0.350–4.500)

## 2013-07-14 ENCOUNTER — Encounter: Payer: Self-pay | Admitting: Family Medicine

## 2013-07-14 DIAGNOSIS — I701 Atherosclerosis of renal artery: Secondary | ICD-10-CM | POA: Insufficient documentation

## 2013-07-14 NOTE — Progress Notes (Signed)
Patient ID: Karen Pierce, female   DOB: [birth year], 63 y.o.   MRN: [id number] Karen Pierce [id number] July 25, 1950 07/14/2013      Progress Note-Follow Up  Subjective  Chief Complaint  Chief Complaint  Patient presents with  . Follow-up    3 month    HPI  Patient is a 63 year old female in today for routine medical care. Doing well. Has had some trouble with stomach acid but has been using some over-the-counter acid reducing medicines with good relief. Mood is improved with Lexapro tolerating atorvastatin without myalgias. Denies CP/palp/SOB/HA/congestion/fevers/GI or GU c/o. Taking meds as prescribed  Past Medical History  Diagnosis Date  . Hiatal hernia   . Breast cyst     dense  . Chicken pox as a kid  . Hyperlipidemia   . GERD (gastroesophageal reflux disease)   . Renal artery stenosis     kidney stone  . Colon polyp   . Preventative health care 10/04/2012  . Other and unspecified hyperlipidemia 10/04/2012  . Kidney stone 10/04/2012  . History of shingles 10/04/2012  . Anxiety and depression 11/16/2012    Past Surgical History  Procedure Laterality Date  . Polpypectomy  08/2004  . Cyst removed off left side  1968    benign, skin    Family History  Problem Relation Age of Onset  . Arthritis Mother   . Heart disease Mother   . Cancer Mother 69    breast, sarcoma x 3 bouts in past, under righ arm  . Hyperlipidemia Mother   . Ulcers Father   . Hypertension Father   . Arthritis Father   . Heart disease Father     CHF  . Heart disease Maternal Grandmother   . Heart disease Paternal Grandmother   . Heart disease Paternal Grandfather   . Ulcers Paternal Grandfather   . Cancer Brother     pancreatic  . Heart disease Maternal Grandfather   . Heart attack Maternal Grandfather   . Pneumonia Sister   . Arthritis Sister     rheumatoid    History   Social History  . Marital Status: Single    Spouse Name: N/A    Number of Children: N/A  . Years of Education:  N/A   Occupational History  . Not on file.   Social History Main Topics  . Smoking status: Former Smoker -- 0.50 packs/day for 10 years    Types: Cigarettes    Start date: 04/18/1988  . Smokeless tobacco: Never Used  . Alcohol Use: Yes     Comment: occasionally  . Drug Use: No  . Sexual Activity: Yes    Partners: Male   Other Topics Concern  . Not on file   Social History Narrative  . No narrative on file    Current Outpatient Prescriptions on File Prior to Visit  Medication Sig Dispense Refill  . ALPRAZolam (XANAX) 0.25 MG tablet Take 1 tablet (0.25 mg total) by mouth 2 (two) times daily as needed for sleep or anxiety.  20 tablet  1  . aspirin 81 MG EC tablet Take 81 mg by mouth daily.        . calcium carbonate (TUMS - DOSED IN MG ELEMENTAL CALCIUM) 500 MG chewable tablet Chew 1 tablet by mouth daily.      . Cholecalciferol (VITAMIN D3) 5000 UNITS CAPS Take 1 capsule by mouth daily.      Javier Docker Oil CAPS Take by mouth.      Marland Kitchen  Multiple Vitamins-Minerals (MULTIVITAMIN WITH MINERALS) tablet Take 1 tablet by mouth daily.         No current facility-administered medications on file prior to visit.    No Known Allergies  Review of Systems  Review of Systems  Constitutional: Negative for fever and malaise/fatigue.  HENT: Negative for congestion.   Eyes: Negative for pain and discharge.  Respiratory: Negative for shortness of breath.   Cardiovascular: Negative for chest pain, palpitations and leg swelling.  Gastrointestinal: Negative for nausea, abdominal pain and diarrhea.  Genitourinary: Negative for dysuria.  Musculoskeletal: Negative for falls.  Skin: Negative for rash.  Neurological: Negative for loss of consciousness and headaches.  Endo/Heme/Allergies: Negative for polydipsia.  Psychiatric/Behavioral: Negative for depression and suicidal ideas. The patient is not nervous/anxious and does not have insomnia.     Objective  BP 124/60  Pulse 67  Temp(Src) 98.4  F (36.9 C) (Oral)  Ht 5\' 7"  (1.702 m)  Wt 176 lb 0.6 oz (79.851 kg)  BMI 27.57 kg/m2  SpO2 97%  LMP 04/19/2007  Physical Exam  Physical Exam  Constitutional: She is oriented to person, place, and time and well-developed, well-nourished, and in no distress. No distress.  HENT:  Head: Normocephalic and atraumatic.  Eyes: Conjunctivae are normal.  Neck: Neck supple. No thyromegaly present.  Cardiovascular: Normal rate, regular rhythm and normal heart sounds.   No murmur heard. Pulmonary/Chest: Effort normal and breath sounds normal. She has no wheezes.  Abdominal: She exhibits no distension and no mass.  Musculoskeletal: She exhibits no edema.  Lymphadenopathy:    She has no cervical adenopathy.  Neurological: She is alert and oriented to person, place, and time.  Skin: Skin is warm and dry. No rash noted. She is not diaphoretic.  Psychiatric: Memory, affect and judgment normal.    Lab Results  Component Value Date   TSH 1.462 07/11/2013   Lab Results  Component Value Date   WBC 6.6 07/11/2013   HGB 14.0 07/11/2013   HCT 41.4 07/11/2013   MCV 89.0 07/11/2013   PLT 325 07/11/2013   Lab Results  Component Value Date   CREATININE 0.73 07/11/2013   BUN 21 07/11/2013   NA 141 07/11/2013   K 4.5 07/11/2013   CL 102 07/11/2013   CO2 30 07/11/2013   Lab Results  Component Value Date   ALT 23 07/11/2013   AST 19 07/11/2013   ALKPHOS 67 07/11/2013   BILITOT 0.6 07/11/2013   Lab Results  Component Value Date   CHOL 165 07/11/2013   Lab Results  Component Value Date   HDL 61 07/11/2013   Lab Results  Component Value Date   LDLCALC 94 07/11/2013   Lab Results  Component Value Date   TRIG 52 07/11/2013   Lab Results  Component Value Date   CHOLHDL 2.7 07/11/2013      Assessment & Plan  Other and unspecified hyperlipidemia Tolerating statin, encouraged heart healthy diet, avoid trans fats, minimize simple carbs and saturated fats. Increase exercise as tolerated  GERD  (gastroesophageal reflux disease) Avoid offending foods, start probiotics. Do not eat large meals in late evening and consider raising head of bed.   Anxiety and depression Improved with Lexapro, given refills today

## 2013-07-14 NOTE — Assessment & Plan Note (Signed)
Tolerating statin, encouraged heart healthy diet, avoid trans fats, minimize simple carbs and saturated fats. Increase exercise as tolerated 

## 2013-07-14 NOTE — Assessment & Plan Note (Signed)
Avoid offending foods, start probiotics. Do not eat large meals in late evening and consider raising head of bed.  

## 2013-07-14 NOTE — Assessment & Plan Note (Signed)
Improved with Lexapro, given refills today

## 2014-01-09 ENCOUNTER — Encounter: Payer: PRIVATE HEALTH INSURANCE | Admitting: Family Medicine

## 2014-01-13 ENCOUNTER — Encounter: Payer: Self-pay | Admitting: Family Medicine

## 2014-01-13 ENCOUNTER — Ambulatory Visit (INDEPENDENT_AMBULATORY_CARE_PROVIDER_SITE_OTHER): Payer: PRIVATE HEALTH INSURANCE | Admitting: Family Medicine

## 2014-01-13 VITALS — BP 127/67 | HR 59 | Temp 98.6°F | Ht 67.0 in | Wt 186.6 lb

## 2014-01-13 DIAGNOSIS — E785 Hyperlipidemia, unspecified: Secondary | ICD-10-CM

## 2014-01-13 DIAGNOSIS — Z79818 Long term (current) use of other agents affecting estrogen receptors and estrogen levels: Secondary | ICD-10-CM

## 2014-01-13 DIAGNOSIS — Z0000 Encounter for general adult medical examination without abnormal findings: Secondary | ICD-10-CM

## 2014-01-13 DIAGNOSIS — K219 Gastro-esophageal reflux disease without esophagitis: Secondary | ICD-10-CM

## 2014-01-13 LAB — RENAL FUNCTION PANEL
Albumin: 4.1 g/dL (ref 3.5–5.2)
BUN: 20 mg/dL (ref 6–23)
CALCIUM: 8.8 mg/dL (ref 8.4–10.5)
CO2: 28 mEq/L (ref 19–32)
Chloride: 103 mEq/L (ref 96–112)
Creatinine, Ser: 0.7 mg/dL (ref 0.4–1.2)
GFR: 88.92 mL/min (ref >60.00)
Glucose, Bld: 95 mg/dL (ref 70–99)
POTASSIUM: 3.9 meq/L (ref 3.5–5.1)
Phosphorus: 3.3 mg/dL (ref 2.3–4.6)
Sodium: 136 mEq/L (ref 135–145)

## 2014-01-13 LAB — LIPID PANEL
CHOL/HDL RATIO: 2
Cholesterol: 182 mg/dL (ref 0–200)
HDL: 78 mg/dL (ref >39.00)
LDL Cholesterol: 95 mg/dL (ref 0–99)
NONHDL: 104
Triglycerides: 47 mg/dL (ref 0.0–149.0)
VLDL: 9.4 mg/dL (ref 0.0–40.0)

## 2014-01-13 LAB — HEPATIC FUNCTION PANEL
ALK PHOS: 61 U/L (ref 39–117)
ALT: 25 U/L (ref 0–35)
AST: 23 U/L (ref 0–37)
Albumin: 4.1 g/dL (ref 3.5–5.2)
BILIRUBIN DIRECT: 0 mg/dL (ref 0.0–0.3)
TOTAL PROTEIN: 6.6 g/dL (ref 6.0–8.3)
Total Bilirubin: 0.6 mg/dL (ref 0.2–1.2)

## 2014-01-13 LAB — CBC
HCT: 41.1 % (ref 36.0–46.0)
Hemoglobin: 13.6 g/dL (ref 12.0–15.0)
MCHC: 33.2 g/dL (ref 30.0–36.0)
MCV: 93.4 fl (ref 78.0–100.0)
PLATELETS: 272 10*3/uL (ref 150.0–400.0)
RBC: 4.4 Mil/uL (ref 3.87–5.11)
RDW: 13.5 % (ref 11.5–15.5)
WBC: 6.1 10*3/uL (ref 4.0–10.5)

## 2014-01-13 LAB — TSH: TSH: 1.08 u[IU]/mL (ref 0.35–4.50)

## 2014-01-13 NOTE — Progress Notes (Signed)
Pre visit review using our clinic review tool, if applicable. No additional management support is needed unless otherwise documented below in the visit note. 

## 2014-01-13 NOTE — Progress Notes (Signed)
Patient ID: Karen Pierce, female   DOB: October 08, 1950, 63 y.o.   MRN: [id number] BLESSING ZAUCHA [id number] [birth year] 01/13/2014      Progress Note-Follow Up  Subjective  Chief Complaint  Chief Complaint  Patient presents with  . Annual Exam    physical/ fasting    HPI  Patient is a 63 year old female in today for routine medical care. Doing well, no recent illness. Denies CP/palp/SOB/HA/congestion/fevers/GI or GU c/o. Taking meds as prescribed  Past Medical History  Diagnosis Date  . Hiatal hernia   . Breast cyst     dense  . Chicken pox as a kid  . Hyperlipidemia   . GERD (gastroesophageal reflux disease)   . Renal artery stenosis     kidney stone  . Colon polyp   . Preventative health care 10/04/2012  . Other and unspecified hyperlipidemia 10/04/2012  . Kidney stone 10/04/2012  . History of shingles 10/04/2012  . Anxiety and depression 11/16/2012    Past Surgical History  Procedure Laterality Date  . Polpypectomy  08/2004  . Cyst removed off left side  1968    benign, skin    Family History  Problem Relation Age of Onset  . Arthritis Mother   . Heart disease Mother   . Cancer Mother 27    breast, sarcoma x 3 bouts in past, under righ arm  . Hyperlipidemia Mother   . Ulcers Father   . Hypertension Father   . Arthritis Father   . Heart disease Father     CHF  . Heart disease Maternal Grandmother   . Heart disease Paternal Grandmother   . Heart disease Paternal Grandfather   . Ulcers Paternal Grandfather   . Cancer Brother     pancreatic  . Heart disease Maternal Grandfather   . Heart attack Maternal Grandfather   . Pneumonia Sister   . Arthritis Sister     rheumatoid    History   Social History  . Marital Status: Single    Spouse Name: N/A    Number of Children: N/A  . Years of Education: N/A   Occupational History  . Not on file.   Social History Main Topics  . Smoking status: Former Smoker -- 0.50 packs/day for 10 years    Types: Cigarettes     Start date: 04/18/1988  . Smokeless tobacco: Never Used  . Alcohol Use: Yes     Comment: occasionally  . Drug Use: No  . Sexual Activity: Yes    Partners: Male   Other Topics Concern  . Not on file   Social History Narrative  . No narrative on file    Current Outpatient Prescriptions on File Prior to Visit  Medication Sig Dispense Refill  . ALPRAZolam (XANAX) 0.25 MG tablet Take 1 tablet (0.25 mg total) by mouth 2 (two) times daily as needed for sleep or anxiety.  20 tablet  1  . aspirin 81 MG EC tablet Take 81 mg by mouth daily.        Marland Kitchen atorvastatin (LIPITOR) 10 MG tablet Take 1 tablet (10 mg total) by mouth daily.  30 tablet  5  . calcium carbonate (TUMS - DOSED IN MG ELEMENTAL CALCIUM) 500 MG chewable tablet Chew 1 tablet by mouth daily.      . Cholecalciferol (VITAMIN D3) 5000 UNITS CAPS Take 1 capsule by mouth daily.      Marland Kitchen escitalopram (LEXAPRO) 10 MG tablet Take 1 tablet (10 mg total) by mouth daily.  PRN  30 tablet  5  . Krill Oil CAPS Take by mouth.      . Multiple Vitamins-Minerals (MULTIVITAMIN WITH MINERALS) tablet Take 1 tablet by mouth daily.        . Probiotic Product (PROBIOTIC DAILY PO) Take by mouth.       No current facility-administered medications on file prior to visit.    No Known Allergies  Review of Systems  Review of Systems  Constitutional: Negative for fever and malaise/fatigue.  HENT: Negative for congestion.   Eyes: Negative for discharge.  Respiratory: Negative for shortness of breath.   Cardiovascular: Negative for chest pain, palpitations and leg swelling.  Gastrointestinal: Negative for nausea, abdominal pain and diarrhea.  Genitourinary: Negative for dysuria.  Musculoskeletal: Negative for falls.  Skin: Negative for rash.  Neurological: Negative for loss of consciousness and headaches.  Endo/Heme/Allergies: Negative for polydipsia.  Psychiatric/Behavioral: Negative for depression and suicidal ideas. The patient is not  nervous/anxious and does not have insomnia.     Objective  BP 127/67  Pulse 59  Temp(Src) 98.6 F (37 C) (Oral)  Ht 5\' 7"  (1.702 m)  Wt 186 lb 9.6 oz (84.641 kg)  BMI 29.22 kg/m2  SpO2 97%  LMP 04/19/2007  Physical Exam  Physical Exam  Constitutional: She is oriented to person, place, and time and well-developed, well-nourished, and in no distress. No distress.  HENT:  Head: Normocephalic and atraumatic.  Eyes: Conjunctivae are normal.  Neck: Neck supple. No thyromegaly present.  Cardiovascular: Normal rate, regular rhythm and normal heart sounds.   No murmur heard. Pulmonary/Chest: Effort normal and breath sounds normal. She has no wheezes.  Abdominal: She exhibits no distension and no mass.  Musculoskeletal: She exhibits no edema.  Lymphadenopathy:    She has no cervical adenopathy.  Neurological: She is alert and oriented to person, place, and time.  Skin: Skin is warm and dry. No rash noted. She is not diaphoretic.  Psychiatric: Memory, affect and judgment normal.    Lab Results  Component Value Date   TSH 1.462 07/11/2013   Lab Results  Component Value Date   WBC 6.6 07/11/2013   HGB 14.0 07/11/2013   HCT 41.4 07/11/2013   MCV 89.0 07/11/2013   PLT 325 07/11/2013   Lab Results  Component Value Date   CREATININE 0.73 07/11/2013   BUN 21 07/11/2013   NA 141 07/11/2013   K 4.5 07/11/2013   CL 102 07/11/2013   CO2 30 07/11/2013   Lab Results  Component Value Date   ALT 23 07/11/2013   AST 19 07/11/2013   ALKPHOS 67 07/11/2013   BILITOT 0.6 07/11/2013   Lab Results  Component Value Date   CHOL 165 07/11/2013   Lab Results  Component Value Date   HDL 61 07/11/2013   Lab Results  Component Value Date   LDLCALC 94 07/11/2013   Lab Results  Component Value Date   TRIG 52 07/11/2013   Lab Results  Component Value Date   CHOLHDL 2.7 07/11/2013     Assessment & Plan   GERD (gastroesophageal reflux disease) Avoid offending foods, start probiotics. Do not  eat large meals in late evening and consider raising head of bed.   Preventative health care Patient encouraged to maintain heart healthy diet, regular exercise, adequate sleep. Consider daily probiotics. Take medications as prescribed. Getting flu shot at work. MGM utd and colonoscopy 2009

## 2014-01-13 NOTE — Patient Instructions (Signed)
Zinc 50 mg daily, Biotin daily   Cholesterol Cholesterol is a white, waxy, fat-like substance needed by your body in small amounts. The liver makes all the cholesterol you need. Cholesterol is carried from the liver by the blood through the blood vessels. Deposits of cholesterol (plaque) may build up on blood vessel walls. These make the arteries narrower and stiffer. Cholesterol plaques increase the risk for heart attack and stroke.  You cannot feel your cholesterol level even if it is very high. The only way to know it is high is with a blood test. Once you know your cholesterol levels, you should keep a record of the test results. Work with your health care provider to keep your levels in the desired range.  WHAT DO THE RESULTS MEAN?    Total cholesterol is a rough measure of all the cholesterol in your blood.   LDL is the so-called bad cholesterol. This is the type that deposits cholesterol in the walls of the arteries. You want this level to be low.   HDL is the good cholesterol because it cleans the arteries and carries the LDL away. You want this level to be high.  Triglycerides are fat that the body can either burn for energy or store. High levels are closely linked to heart disease.  WHAT ARE THE DESIRED LEVELS OF CHOLESTEROL?  Total cholesterol below 200.   LDL below 100 for people at risk, below 70 for those at very high risk.   HDL above 50 is good, above 60 is best.   Triglycerides below 150.  HOW CAN I LOWER MY CHOLESTEROL?  Diet. Follow your diet programs as directed by your health care provider.   Choose fish or white meat chicken and [place], roasted or baked. Limit fatty cuts of red meat, fried foods, and processed meats, such as sausage and lunch meats.   Eat lots of fresh fruits and vegetables.  Choose whole grains, beans, pasta, potatoes, and cereals.   Use only small amounts of olive, corn, or canola oils.   Avoid butter, mayonnaise, shortening, or  palm kernel oils.  Avoid foods with trans fats.   Drink skim or nonfat milk and eat low-fat or nonfat yogurt and cheeses. Avoid whole milk, cream, ice cream, egg yolks, and full-fat cheeses.   Healthy desserts include angel food cake, ginger snaps, animal crackers, hard candy, popsicles, and low-fat or nonfat frozen yogurt. Avoid pastries, cakes, pies, and cookies.   Exercise. Follow your exercise programs as directed by your health care provider.   A regular program helps decrease LDL and raise HDL.   A regular program helps with weight control.   Do things that increase your activity level like gardening, walking, or taking the stairs. Ask your health care provider about how you can be more active in your daily life.   Medicine. Take medicine only as directed by your health care provider.   Medicine may be prescribed by your health care provider to help lower cholesterol and decrease the risk for heart disease.   If you have several risk factors, you may need medicine even if your levels are normal. Document Released: 12/28/2000 Document Revised: 08/19/2013 Document Reviewed: 01/16/2013 [facility] Patient Information 2015 [place], [place]. This information is not intended to replace advice given to you by your health care provider. Make sure you discuss any questions you have with your health care provider.

## 2014-01-15 NOTE — Assessment & Plan Note (Addendum)
Patient encouraged to maintain heart healthy diet, regular exercise, adequate sleep. Consider daily probiotics. Take medications as prescribed. Getting flu shot at work. MGM utd and colonoscopy 2009

## 2014-01-15 NOTE — Assessment & Plan Note (Signed)
Avoid offending foods, start probiotics. Do not eat large meals in late evening and consider raising head of bed.  

## 2014-02-25 ENCOUNTER — Other Ambulatory Visit: Payer: Self-pay | Admitting: Family Medicine

## 2014-11-20 ENCOUNTER — Encounter: Payer: Self-pay | Admitting: Family Medicine

## 2014-11-20 ENCOUNTER — Ambulatory Visit (INDEPENDENT_AMBULATORY_CARE_PROVIDER_SITE_OTHER): Payer: PRIVATE HEALTH INSURANCE | Admitting: Family Medicine

## 2014-11-20 VITALS — BP 122/80 | HR 59 | Temp 98.2°F | Ht 68.0 in | Wt 190.1 lb

## 2014-11-20 DIAGNOSIS — K219 Gastro-esophageal reflux disease without esophagitis: Secondary | ICD-10-CM

## 2014-11-20 DIAGNOSIS — I701 Atherosclerosis of renal artery: Secondary | ICD-10-CM | POA: Diagnosis not present

## 2014-11-20 DIAGNOSIS — I739 Peripheral vascular disease, unspecified: Secondary | ICD-10-CM

## 2014-11-20 DIAGNOSIS — R350 Frequency of micturition: Secondary | ICD-10-CM | POA: Diagnosis not present

## 2014-11-20 DIAGNOSIS — E782 Mixed hyperlipidemia: Secondary | ICD-10-CM | POA: Diagnosis not present

## 2014-11-20 DIAGNOSIS — R600 Localized edema: Secondary | ICD-10-CM

## 2014-11-20 MED ORDER — ATORVASTATIN CALCIUM 10 MG PO TABS: 10.0000 mg | ORAL_TABLET | Freq: Every day | ORAL | Status: DC

## 2014-11-20 MED ORDER — FUROSEMIDE 20 MG PO TABS: 20.0000 mg | ORAL_TABLET | Freq: Every day | ORAL | Status: DC | PRN

## 2014-11-20 NOTE — Progress Notes (Signed)
Pre visit review using our clinic review tool, if applicable. No additional management support is needed unless otherwise documented below in the visit note. 

## 2014-11-20 NOTE — Patient Instructions (Signed)

## 2014-11-21 LAB — CBC
HEMATOCRIT: 41.1 % (ref 36.0–46.0)
Hemoglobin: 13.8 g/dL (ref 12.0–15.0)
MCHC: 33.6 g/dL (ref 30.0–36.0)
MCV: 91.3 fl (ref 78.0–100.0)
PLATELETS: 298 10*3/uL (ref 150.0–400.0)
RBC: 4.5 Mil/uL (ref 3.87–5.11)
RDW: 13 % (ref 11.5–15.5)
WBC: 7.1 10*3/uL (ref 4.0–10.5)

## 2014-11-21 LAB — COMPREHENSIVE METABOLIC PANEL
ALBUMIN: 4.2 g/dL (ref 3.5–5.2)
ALT: 21 U/L (ref 0–35)
AST: 20 U/L (ref 0–37)
Alkaline Phosphatase: 67 U/L (ref 39–117)
BUN: 27 mg/dL — ABNORMAL HIGH (ref 6–23)
CALCIUM: 9.4 mg/dL (ref 8.4–10.5)
CHLORIDE: 106 meq/L (ref 96–112)
CO2: 29 mEq/L (ref 19–32)
Creatinine, Ser: 0.76 mg/dL (ref 0.40–1.20)
GFR: 81.98 mL/min (ref >60.00)
Glucose, Bld: 90 mg/dL (ref 70–99)
Potassium: 4 mEq/L (ref 3.5–5.1)
Sodium: 141 mEq/L (ref 135–145)
TOTAL PROTEIN: 6.5 g/dL (ref 6.0–8.3)
Total Bilirubin: 0.4 mg/dL (ref 0.2–1.2)

## 2014-11-21 LAB — URINALYSIS, ROUTINE W REFLEX MICROSCOPIC
BILIRUBIN URINE: NEGATIVE
Ketones, ur: NEGATIVE
Leukocytes, UA: NEGATIVE
Nitrite: NEGATIVE
RBC / HPF: NONE SEEN (ref 0–?)
Specific Gravity, Urine: 1.025 (ref 1.000–1.030)
Total Protein, Urine: NEGATIVE
Urine Glucose: NEGATIVE
Urobilinogen, UA: 0.2 (ref 0.0–1.0)
WBC UA: NONE SEEN (ref 0–?)
pH: 6 (ref 5.0–8.0)

## 2014-11-21 LAB — TSH: TSH: 1.1 u[IU]/mL (ref 0.35–4.50)

## 2014-11-24 ENCOUNTER — Other Ambulatory Visit: Payer: Self-pay | Admitting: Family Medicine

## 2014-11-24 DIAGNOSIS — N390 Urinary tract infection, site not specified: Secondary | ICD-10-CM

## 2014-11-24 DIAGNOSIS — E860 Dehydration: Secondary | ICD-10-CM

## 2014-11-30 DIAGNOSIS — R350 Frequency of micturition: Secondary | ICD-10-CM | POA: Insufficient documentation

## 2014-11-30 DIAGNOSIS — R600 Localized edema: Secondary | ICD-10-CM | POA: Insufficient documentation

## 2014-11-30 NOTE — Assessment & Plan Note (Signed)
Encouraged daily aspirin, heart healthy diet and increased activity.

## 2014-11-30 NOTE — Assessment & Plan Note (Signed)
Avoid offending foods, take probiotics. Do not eat large meals in late evening and consider raising head of bed.  

## 2014-11-30 NOTE — Assessment & Plan Note (Signed)
Urine culture unremarkable except for some calcium crystals, asymptomatic, if she develops symptoms may need to consider referral to urology.

## 2014-11-30 NOTE — Progress Notes (Signed)
Patient ID: CAMBER NINH, female    DOB: 1950/09/18  Age: 64 y.o. MRN: [id number]    Subjective:  Subjective HPI ALYSS GRANATO presents for patient is in today for follow-up and her major complaint is of edema in her ankle most notably on the left but also slightly on the right. It comes and goes and is better in the a.m. No trauma or pain is associated. No recent illness or acute concerns otherwise. Denies CP/palp/SOB/HA/congestion/fevers/GI or GU c/o. Taking meds as prescribed  Review of Systems  Constitutional: Negative.   HENT: Negative.   Eyes: Negative.   Respiratory: Negative.   Cardiovascular: Positive for leg swelling.  Gastrointestinal: Negative.   Endocrine: Negative.   Genitourinary: Negative.   Musculoskeletal: Positive for myalgias.  Psychiatric/Behavioral: Negative.     History Past Medical History  Diagnosis Date  . Hiatal hernia   . Breast cyst     dense  . Chicken pox as a kid  . Hyperlipidemia   . GERD (gastroesophageal reflux disease)   . Renal artery stenosis     kidney stone  . Colon polyp   . Preventative health care 10/04/2012  . Other and unspecified hyperlipidemia 10/04/2012  . Kidney stone 10/04/2012  . History of shingles 10/04/2012  . Anxiety and depression 11/16/2012    She has past surgical history that includes POLPYPECTOMY (08/2004) and cyst removed off left side (1968).   Her family history includes Arthritis in her father, mother, and sister; Cancer in her brother; Cancer (age of onset: 46) in her mother; Heart attack in her maternal grandfather; Heart disease in her father, maternal grandfather, maternal grandmother, mother, paternal grandfather, and paternal grandmother; Hyperlipidemia in her mother; Hypertension in her father; Macular degeneration in her father; Pneumonia in her sister; Ulcers in her father and paternal grandfather.She reports that she has quit smoking. Her smoking use included Cigarettes. She started smoking about 26 years  ago. She has a 5 pack-year smoking history. She has never used smokeless tobacco. She reports that she drinks alcohol. She reports that she does not use illicit drugs.  Current Outpatient Prescriptions on File Prior to Visit  Medication Sig Dispense Refill  . aspirin 81 MG EC tablet Take 81 mg by mouth daily.      . calcium carbonate (TUMS - DOSED IN MG ELEMENTAL CALCIUM) 500 MG chewable tablet Chew 1 tablet by mouth daily.    . Cholecalciferol (VITAMIN D3) 5000 UNITS CAPS Take 1 capsule by mouth daily.    Javier Docker Oil CAPS Take by mouth.    . Multiple Vitamins-Minerals (MULTIVITAMIN WITH MINERALS) tablet Take 1 tablet by mouth daily.      . Probiotic Product (PROBIOTIC DAILY PO) Take by mouth.     No current facility-administered medications on file prior to visit.     Objective:  Objective Physical Exam  Constitutional: She is oriented to person, place, and time. She appears well-developed and well-nourished. No distress.  HENT:  Head: Normocephalic and atraumatic.  Nose: Nose normal.  Eyes: Right eye exhibits no discharge. Left eye exhibits no discharge.  Neck: Normal range of motion. Neck supple.  Cardiovascular: Normal rate and regular rhythm.   No murmur heard. Pulmonary/Chest: Effort normal and breath sounds normal.  Abdominal: Soft. Bowel sounds are normal. There is no tenderness.  Musculoskeletal: Normal range of motion. She exhibits edema. She exhibits no tenderness.  Trace pedal edema b/l  Neurological: She is alert and oriented to person, place, and time.  Skin: Skin  is warm and dry.  Psychiatric: She has a normal mood and affect.  Nursing note and vitals reviewed.  BP 122/80 mmHg  Pulse 59  Temp(Src) 98.2 F (36.8 C) (Oral)  Ht '5\' 8"'  (1.727 m)  Wt 190 lb 2 oz (86.24 kg)  BMI 28.92 kg/m2  SpO2 95%  LMP 04/19/2007 Wt Readings from Last 3 Encounters:  11/20/14 190 lb 2 oz (86.24 kg)  01/13/14 186 lb 9.6 oz (84.641 kg)  07/11/13 176 lb 0.6 oz (79.851 kg)      Lab Results  Component Value Date   WBC 7.1 11/20/2014   HGB 13.8 11/20/2014   HCT 41.1 11/20/2014   PLT 298.0 11/20/2014   GLUCOSE 90 11/20/2014   CHOL 182 01/13/2014   TRIG 47.0 01/13/2014   HDL 78.00 01/13/2014   LDLCALC 95 01/13/2014   ALT 21 11/20/2014   AST 20 11/20/2014   NA 141 11/20/2014   K 4.0 11/20/2014   CL 106 11/20/2014   CREATININE 0.76 11/20/2014   BUN 27* 11/20/2014   CO2 29 11/20/2014   TSH 1.10 11/20/2014   HGBA1C 5.3 11/07/2012    No results found.   Assessment & Plan:  Plan I have discontinued Ms. Voorhees's escitalopram. I have also changed her atorvastatin. Additionally, I am having her start on furosemide. Lastly, I am having her maintain her aspirin, multivitamin with minerals, calcium carbonate, Vitamin D3, Krill Oil, and Probiotic Product (PROBIOTIC DAILY PO).  Meds ordered this encounter  Medications  . atorvastatin (LIPITOR) 10 MG tablet    Sig: Take 1 tablet (10 mg total) by mouth daily.    Dispense:  30 tablet    Refill:  6  . furosemide (LASIX) 20 MG tablet    Sig: Take 1 tablet (20 mg total) by mouth daily as needed for fluid or edema (sob/Weight gain>3# in 24 hours).    Dispense:  30 tablet    Refill:  2    Problem List Items Addressed This Visit    Urinary frequency    Urine culture unremarkable except for some calcium crystals, asymptomatic, if she develops symptoms may need to consider referral to urology.       Relevant Medications   furosemide (LASIX) 20 MG tablet   Other Relevant Orders   Comp Met (CMET) (Completed)   CBC (Completed)   TSH (Completed)   Urinalysis   US Renal   Ambulatory referral to Cardiology   RENAL ARTERY STENOSIS    Follows with cardiology, referred back for ongoing surveillance      Relevant Medications   atorvastatin (LIPITOR) 10 MG tablet   furosemide (LASIX) 20 MG tablet   Peripheral vascular disease    Encouraged daily aspirin, heart healthy diet and increased activity.        Relevant Medications   atorvastatin (LIPITOR) 10 MG tablet   furosemide (LASIX) 20 MG tablet   Pedal edema    Minimize sodium, elevate feet above heart 15 minutes as needed. Consider compression hose prn.      Relevant Medications   furosemide (LASIX) 20 MG tablet   Other Relevant Orders   Comp Met (CMET) (Completed)   CBC (Completed)   TSH (Completed)   Urinalysis   US Renal   Ambulatory referral to Cardiology   Hyperlipidemia, mixed    Encouraged heart healthy diet, increase exercise, avoid trans fats, consider a krill oil cap daily      Relevant Medications   atorvastatin (LIPITOR) 10 MG tablet   furosemide (  LASIX) 20 MG tablet   Other Relevant Orders   Comp Met (CMET) (Completed)   CBC (Completed)   TSH (Completed)   Urinalysis   US Renal   Ambulatory referral to Cardiology   GERD (gastroesophageal reflux disease)    Avoid offending foods, take probiotics. Do not eat large meals in late evening and consider raising head of bed.        Other Visit Diagnoses    Renal artery stenosis    -  Primary    Relevant Medications    atorvastatin (LIPITOR) 10 MG tablet    furosemide (LASIX) 20 MG tablet    Other Relevant Orders    Comp Met (CMET) (Completed)    CBC (Completed)    TSH (Completed)    Urinalysis    US Renal    Ambulatory referral to Cardiology       Follow-up: No Follow-up on file.  Penni Homans, MD

## 2014-11-30 NOTE — Assessment & Plan Note (Signed)
Encouraged heart healthy diet, increase exercise, avoid trans fats, consider a krill oil cap daily 

## 2014-11-30 NOTE — Assessment & Plan Note (Signed)
Follows with cardiology, referred back for ongoing surveillance

## 2014-11-30 NOTE — Assessment & Plan Note (Signed)
Minimize sodium, elevate feet above heart 15 minutes as needed. Consider compression hose prn.

## 2014-12-16 ENCOUNTER — Ambulatory Visit
Admission: RE | Admit: 2014-12-16 | Discharge: 2014-12-16 | Disposition: A | Discharge status: Home / Self Care | Payer: PRIVATE HEALTH INSURANCE | Source: Ambulatory Visit | Attending: Family Medicine | Admitting: Family Medicine

## 2014-12-16 DIAGNOSIS — R350 Frequency of micturition: Secondary | ICD-10-CM

## 2014-12-16 DIAGNOSIS — R600 Localized edema: Secondary | ICD-10-CM

## 2014-12-16 DIAGNOSIS — E782 Mixed hyperlipidemia: Secondary | ICD-10-CM

## 2014-12-16 DIAGNOSIS — I701 Atherosclerosis of renal artery: Secondary | ICD-10-CM

## 2015-01-01 ENCOUNTER — Ambulatory Visit (INDEPENDENT_AMBULATORY_CARE_PROVIDER_SITE_OTHER): Payer: PRIVATE HEALTH INSURANCE | Admitting: Medical

## 2015-01-01 ENCOUNTER — Encounter: Payer: Self-pay | Admitting: Medical

## 2015-01-01 VITALS — BP 120/80 | HR 98 | Temp 98.0°F | Resp 16 | Ht 68.0 in | Wt 185.0 lb

## 2015-01-01 DIAGNOSIS — J208 Acute bronchitis due to other specified organisms: Secondary | ICD-10-CM | POA: Diagnosis not present

## 2015-01-01 DIAGNOSIS — R059 Cough, unspecified: Secondary | ICD-10-CM

## 2015-01-01 DIAGNOSIS — R0982 Postnasal drip: Secondary | ICD-10-CM

## 2015-01-01 DIAGNOSIS — J329 Chronic sinusitis, unspecified: Secondary | ICD-10-CM | POA: Diagnosis not present

## 2015-01-01 DIAGNOSIS — R067 Sneezing: Secondary | ICD-10-CM | POA: Diagnosis not present

## 2015-01-01 DIAGNOSIS — R05 Cough: Secondary | ICD-10-CM

## 2015-01-01 MED ORDER — HYDROCODONE-HOMATROPINE 5-1.5 MG/5ML PO SYRP: 5.0000 mL | ORAL_SOLUTION | Freq: Three times a day (TID) | ORAL | Status: DC | PRN

## 2015-01-01 MED ORDER — DOXYCYCLINE HYCLATE 100 MG PO TABS: 100.0000 mg | ORAL_TABLET | Freq: Two times a day (BID) | ORAL | Status: DC

## 2015-01-01 MED ORDER — FLUTICASONE PROPIONATE 50 MCG/ACT NA SUSP: 2.0000 | Freq: Every day | NASAL | Status: DC

## 2015-01-01 NOTE — Patient Instructions (Signed)
By exam findings, symptoms and time of the year, you may have some allergy symptoms. Will rx flonase for nasal congestion and hycodan for the cough.  If your symptoms progress indicating bronchitis or sinus infection as discussed then take doxycycline antibiotic.  Follow up in 7-10 days or as needed

## 2015-01-01 NOTE — Progress Notes (Signed)
Subjective:    Patient ID: Karen Pierce, female    DOB: 09-Jun-1950, 64 y.o.   MRN: [id number]  HPI  Pt in states Sunday evening mild st. Then yesterday evening she felt chest congested. She is coughing up just little mucous. Pt states history of getting bronchitis easily. This morning she ws having brief faint  transient episodes of chest pain only during cough spell. Lasted second or so then subside. None currently.  Cough kept her up last night.  Some nasal congestion and sinus pressure.  Pt denies any hx of fall allergies.     Review of Systems  Constitutional: Positive for fever. Negative for chills and fatigue.       Maybe some fever on Tuesday at work. Did not check temp.  HENT: Positive for congestion, postnasal drip and sinus pressure. Negative for sore throat.        Pnd early in beginning. Some sneezing.  Respiratory: Positive for cough. Negative for shortness of breath and wheezing.        Mild productive cough.  Cardiovascular: Negative for chest pain and palpitations.  Gastrointestinal: Negative for abdominal pain.  Genitourinary: Positive for pelvic pain. Negative for frequency and flank pain.  Musculoskeletal: Negative for back pain.  Neurological: Negative for speech difficulty, weakness, numbness and headaches.  Hematological: Negative for adenopathy.  Psychiatric/Behavioral: Negative for behavioral problems and agitation.   Past Medical History  Diagnosis Date  . Hiatal hernia   . Breast cyst     dense  . Chicken pox as a kid  . Hyperlipidemia   . GERD (gastroesophageal reflux disease)   . Renal artery stenosis     kidney stone  . Colon polyp   . Preventative health care 10/04/2012  . Other and unspecified hyperlipidemia 10/04/2012  . Kidney stone 10/04/2012  . History of shingles 10/04/2012  . Anxiety and depression 11/16/2012    Social History   Social History  . Marital Status: Single    Spouse Name: N/A  . Number of Children: N/A  . Years of  Education: N/A   Occupational History  . Not on file.   Social History Main Topics  . Smoking status: Former Smoker -- 0.50 packs/day for 10 years    Types: Cigarettes    Start date: 04/18/1988  . Smokeless tobacco: Never Used  . Alcohol Use: Yes     Comment: occasionally  . Drug Use: No  . Sexual Activity:    Partners: Male   Other Topics Concern  . Not on file   Social History Narrative    Past Surgical History  Procedure Laterality Date  . Polpypectomy  08/2004  . Cyst removed off left side  1968    benign, skin    Family History  Problem Relation Age of Onset  . Arthritis Mother   . Heart disease Mother   . Cancer Mother 53    breast, sarcoma x 3 bouts in past, under righ arm  . Hyperlipidemia Mother   . Ulcers Father   . Hypertension Father   . Arthritis Father   . Heart disease Father     CHF  . Macular degeneration Father   . Heart disease Maternal Grandmother   . Heart disease Paternal Grandmother   . Heart disease Paternal Grandfather   . Ulcers Paternal Grandfather   . Cancer Brother     pancreatic  . Heart disease Maternal Grandfather   . Heart attack Maternal Grandfather   . Pneumonia Sister   .  Arthritis Sister     rheumatoid    No Known Allergies  Current Outpatient Prescriptions on File Prior to Visit  Medication Sig Dispense Refill  . aspirin 81 MG EC tablet Take 81 mg by mouth daily.      Marland Kitchen atorvastatin (LIPITOR) 10 MG tablet Take 1 tablet (10 mg total) by mouth daily. 30 tablet 6  . calcium carbonate (TUMS - DOSED IN MG ELEMENTAL CALCIUM) 500 MG chewable tablet Chew 1 tablet by mouth daily.    . Cholecalciferol (VITAMIN D3) 5000 UNITS CAPS Take 1 capsule by mouth daily.    . furosemide (LASIX) 20 MG tablet Take 1 tablet (20 mg total) by mouth daily as needed for fluid or edema (sob/Weight gain>3# in 24 hours). 30 tablet 2  . Krill Oil CAPS Take by mouth.    . Multiple Vitamins-Minerals (MULTIVITAMIN WITH MINERALS) tablet Take 1 tablet  by mouth daily.      . Probiotic Product (PROBIOTIC DAILY PO) Take by mouth.     No current facility-administered medications on file prior to visit.    BP 120/80 mmHg  Pulse 98  Temp(Src) 98 F (36.7 C) (Oral)  Resp 16  Ht 5\' 8"  (1.727 m)  Wt 185 lb (83.915 kg)  BMI 28.14 kg/m2  SpO2 98%  LMP 04/19/2007       Objective:   Physical Exam  General  Mental Status - Alert. General Appearance - Well groomed. Not in acute distress.  Skin Rashes- No Rashes.  HEENT Head- Normal. Ear Auditory Canal - Left- Normal. Right - Normal.Tympanic Membrane- Left- Normal. Right- Normal. Eye Sclera/Conjunctiva- Left- Normal. Right- Normal. Nose & Sinuses Nasal Mucosa- Left-  Boggy and Congested. Right-  Boggy and  Congested.Bilateral no  maxillary and  nofrontal sinus pressure. Mouth & Throat Lips: Upper Lip- Normal: no dryness, cracking, pallor, cyanosis, or vesicular eruption. Lower Lip-Normal: no dryness, cracking, pallor, cyanosis or vesicular eruption. Buccal Mucosa- Bilateral- No Aphthous ulcers. Oropharynx- No Discharge or Erythema. +pnd  Tonsils: Characteristics- Bilateral- No Erythema or Congestion. Size/Enlargement- Bilateral- No enlargement. Discharge- bilateral-None.  Neck Neck- Supple. No Masses.   Chest and Lung Exam Auscultation: Breath Sounds:-Clear even and unlabored.  Cardiovascular Auscultation:Rythm- Regular, rate and rhythm. Murmurs & Other Heart Sounds:Ausculatation of the heart reveal- No Murmurs.  Lymphatic Head & Neck General Head & Neck Lymphatics: Bilateral: Description- No Localized lymphadenopathy.       Assessment & Plan:  By exam findings, symptoms and time of the year, you may have some allergy symptoms. Will rx flonase for nasal congestion and hycodan for the cough.  If your symptoms progress indicating bronchitis or sinus infection as discussed then take doxycycline antibiotic.  Follow up in 7-10 days or as needed  Note brief transient  chest wall pain only on coughing likely costochondritis like.

## 2015-01-01 NOTE — Progress Notes (Signed)
Pre visit review using our clinic review tool, if applicable. No additional management support is needed unless otherwise documented below in the visit note. 

## 2015-01-12 ENCOUNTER — Other Ambulatory Visit (INDEPENDENT_AMBULATORY_CARE_PROVIDER_SITE_OTHER): Payer: PRIVATE HEALTH INSURANCE

## 2015-01-12 DIAGNOSIS — N390 Urinary tract infection, site not specified: Secondary | ICD-10-CM

## 2015-01-12 DIAGNOSIS — E860 Dehydration: Secondary | ICD-10-CM | POA: Diagnosis not present

## 2015-01-12 LAB — URINALYSIS, ROUTINE W REFLEX MICROSCOPIC
BILIRUBIN URINE: NEGATIVE
KETONES UR: NEGATIVE
LEUKOCYTES UA: NEGATIVE
NITRITE: NEGATIVE
Total Protein, Urine: NEGATIVE
URINE GLUCOSE: NEGATIVE
UROBILINOGEN UA: 0.2 (ref 0.0–1.0)
pH: 6 (ref 5.0–8.0)

## 2015-01-12 LAB — COMPREHENSIVE METABOLIC PANEL
ALBUMIN: 4.1 g/dL (ref 3.5–5.2)
ALT: 20 U/L (ref 0–35)
AST: 20 U/L (ref 0–37)
Alkaline Phosphatase: 76 U/L (ref 39–117)
BUN: 29 mg/dL — ABNORMAL HIGH (ref 6–23)
CALCIUM: 9.4 mg/dL (ref 8.4–10.5)
CHLORIDE: 107 meq/L (ref 96–112)
CO2: 29 meq/L (ref 19–32)
Creatinine, Ser: 0.74 mg/dL (ref 0.40–1.20)
GFR: 84.5 mL/min (ref >60.00)
Glucose, Bld: 94 mg/dL (ref 70–99)
POTASSIUM: 4.8 meq/L (ref 3.5–5.1)
Sodium: 143 mEq/L (ref 135–145)
Total Bilirubin: 0.5 mg/dL (ref 0.2–1.2)
Total Protein: 6.3 g/dL (ref 6.0–8.3)

## 2015-01-13 LAB — URINE CULTURE
Colony Count: NO GROWTH
ORGANISM ID, BACTERIA: NO GROWTH

## 2015-01-14 ENCOUNTER — Telehealth: Payer: Self-pay | Admitting: *Deleted

## 2015-01-14 ENCOUNTER — Encounter: Payer: Self-pay | Admitting: *Deleted

## 2015-01-14 NOTE — Telephone Encounter (Signed)
Pre-Visit Call completed with patient and chart updated.   Pre-Visit Info documented in Specialty Comments under [employer].    

## 2015-01-15 ENCOUNTER — Ambulatory Visit (INDEPENDENT_AMBULATORY_CARE_PROVIDER_SITE_OTHER): Payer: PRIVATE HEALTH INSURANCE | Admitting: Family Medicine

## 2015-01-15 ENCOUNTER — Encounter: Payer: Self-pay | Admitting: Family Medicine

## 2015-01-15 VITALS — BP 118/82 | HR 65 | Temp 98.5°F | Ht 67.0 in | Wt 184.0 lb

## 2015-01-15 DIAGNOSIS — Z1239 Encounter for other screening for malignant neoplasm of breast: Secondary | ICD-10-CM | POA: Diagnosis not present

## 2015-01-15 DIAGNOSIS — N200 Calculus of kidney: Secondary | ICD-10-CM

## 2015-01-15 DIAGNOSIS — Z86010 Personal history of colonic polyps: Secondary | ICD-10-CM

## 2015-01-15 DIAGNOSIS — Z23 Encounter for immunization: Secondary | ICD-10-CM | POA: Diagnosis not present

## 2015-01-15 DIAGNOSIS — Z1211 Encounter for screening for malignant neoplasm of colon: Secondary | ICD-10-CM | POA: Diagnosis not present

## 2015-01-15 DIAGNOSIS — E782 Mixed hyperlipidemia: Secondary | ICD-10-CM | POA: Diagnosis not present

## 2015-01-15 DIAGNOSIS — Z124 Encounter for screening for malignant neoplasm of cervix: Secondary | ICD-10-CM | POA: Diagnosis not present

## 2015-01-15 DIAGNOSIS — Z0000 Encounter for general adult medical examination without abnormal findings: Secondary | ICD-10-CM

## 2015-01-15 DIAGNOSIS — R600 Localized edema: Secondary | ICD-10-CM | POA: Diagnosis not present

## 2015-01-15 DIAGNOSIS — K219 Gastro-esophageal reflux disease without esophagitis: Secondary | ICD-10-CM

## 2015-01-15 DIAGNOSIS — R319 Hematuria, unspecified: Secondary | ICD-10-CM | POA: Diagnosis not present

## 2015-01-15 DIAGNOSIS — N6009 Solitary cyst of unspecified breast: Secondary | ICD-10-CM

## 2015-01-15 HISTORY — DX: Personal history of colonic polyps: Z86.010

## 2015-01-15 LAB — CBC
HCT: 43.4 % (ref 36.0–46.0)
HEMOGLOBIN: 14.4 g/dL (ref 12.0–15.0)
MCHC: 33 g/dL (ref 30.0–36.0)
MCV: 90.9 fl (ref 78.0–100.0)
PLATELETS: 323 10*3/uL (ref 150.0–400.0)
RBC: 4.78 Mil/uL (ref 3.87–5.11)
RDW: 12.9 % (ref 11.5–15.5)
WBC: 6.4 10*3/uL (ref 4.0–10.5)

## 2015-01-15 LAB — TSH: TSH: 1.23 u[IU]/mL (ref 0.35–4.50)

## 2015-01-15 LAB — LIPID PANEL
CHOL/HDL RATIO: 2
Cholesterol: 160 mg/dL (ref 0–200)
HDL: 67.2 mg/dL (ref >39.00)
LDL Cholesterol: 83 mg/dL (ref 0–99)
NONHDL: 93.21
TRIGLYCERIDES: 53 mg/dL (ref 0.0–149.0)
VLDL: 10.6 mg/dL (ref 0.0–40.0)

## 2015-01-15 NOTE — Assessment & Plan Note (Signed)
Avoid offending foods, start probiotics. Do not eat large meals in late evening and consider raising head of bed.  

## 2015-01-15 NOTE — Progress Notes (Signed)
Pre visit review using our clinic review tool, if applicable. No additional management support is needed unless otherwise documented below in the visit note. 

## 2015-01-15 NOTE — Assessment & Plan Note (Signed)
Due next year 

## 2015-01-15 NOTE — Assessment & Plan Note (Signed)
Left ankle, mild, encouraged Jobst stockings, elevate feet, minimize sodium, try topical treatments. Has RLS symptoms in evening after a day of swelling. She declines meds will try topical Salon Pas gel and let us know if it changes

## 2015-01-15 NOTE — Progress Notes (Signed)
Patient ID: Karen Pierce, female   DOB: 09-18-1950, 64 y.o.   MRN: [id number]   Subjective:    Patient ID: Karen Pierce, female    DOB: October 31, 1950, 64 y.o.   MRN: [id number]  Chief Complaint  Patient presents with  . Annual Exam    HPI Patient is in today for annual exam. Receives her flu shot at work but has checked with her insurance and they will pay for her Zostavax so has decided to proceed with that today. Feels well today. Has had an improvement in her restless leg symptoms. Has had some intermittent episodes of right flank pain but none today. Has some intermittent swelling in her left ankle but denies any recent trauma or injury. No recent acute illness. Denies CP/palp/SOB/HA/congestion/fevers/GI or GU c/o. Taking meds as prescribed  Past Medical History  Diagnosis Date  . Hiatal hernia   . Breast cyst     dense  . Chicken pox as a kid  . Hyperlipidemia   . GERD (gastroesophageal reflux disease)   . Renal artery stenosis     kidney stone  . Colon polyp   . Preventative health care 10/04/2012  . Other and unspecified hyperlipidemia 10/04/2012  . Kidney stone 10/04/2012  . History of shingles 10/04/2012  . Anxiety and depression 11/16/2012    Past Surgical History  Procedure Laterality Date  . Polpypectomy  08/2004  . Cyst removed off left side  1968    benign, skin    Family History  Problem Relation Age of Onset  . Arthritis Mother   . Heart disease Mother   . Cancer Mother 47    breast, sarcoma x 3 bouts in past, under righ arm  . Hyperlipidemia Mother   . Ulcers Father   . Hypertension Father   . Arthritis Father   . Heart disease Father     CHF  . Macular degeneration Father   . Heart disease Maternal Grandmother   . Heart disease Paternal Grandmother   . Heart disease Paternal Grandfather   . Ulcers Paternal Grandfather   . Cancer Brother     pancreatic  . Heart disease Maternal Grandfather   . Heart attack Maternal Grandfather   . Pneumonia Sister    . Arthritis Sister     rheumatoid    Social History   Social History  . Marital Status: Single    Spouse Name: N/A  . Number of Children: N/A  . Years of Education: N/A   Occupational History  . Not on file.   Social History Main Topics  . Smoking status: Former Smoker -- 0.50 packs/day for 10 years    Types: Cigarettes    Start date: 04/18/1988  . Smokeless tobacco: Never Used  . Alcohol Use: Yes     Comment: occasionally  . Drug Use: No  . Sexual Activity:    Partners: Male   Other Topics Concern  . Not on file   Social History Narrative    Outpatient Prescriptions Prior to Visit  Medication Sig Dispense Refill  . aspirin 81 MG EC tablet Take 81 mg by mouth daily.      Marland Kitchen atorvastatin (LIPITOR) 10 MG tablet Take 1 tablet (10 mg total) by mouth daily. 30 tablet 6  . calcium carbonate (TUMS - DOSED IN MG ELEMENTAL CALCIUM) 500 MG chewable tablet Chew 1 tablet by mouth daily.    . Cholecalciferol (VITAMIN D3) 5000 UNITS CAPS Take 1 capsule by mouth daily.    Marland Kitchen  fluticasone (FLONASE) 50 MCG/ACT nasal spray Place 2 sprays into both nostrils daily. 16 g 0  . furosemide (LASIX) 20 MG tablet Take 1 tablet (20 mg total) by mouth daily as needed for fluid or edema (sob/Weight gain>3# in 24 hours). 30 tablet 2  . Krill Oil CAPS Take by mouth.    . Multiple Vitamins-Minerals (MULTIVITAMIN WITH MINERALS) tablet Take 1 tablet by mouth daily.      . Probiotic Product (PROBIOTIC DAILY PO) Take by mouth.    Marland Kitchen HYDROcodone-homatropine (HYCODAN) 5-1.5 MG/5ML syrup Take 5 mLs by mouth every 8 (eight) hours as needed for cough. (Patient not taking: Reported on 01/15/2015) 120 mL 0  . doxycycline (VIBRA-TABS) 100 MG tablet Take 1 tablet (100 mg total) by mouth 2 (two) times daily. 20 tablet 0   No facility-administered medications prior to visit.    No Known Allergies  Review of Systems  Constitutional: Negative for fever and malaise/fatigue.  HENT: Negative for congestion.   Eyes:  Negative for discharge.  Respiratory: Negative for shortness of breath.   Cardiovascular: Negative for chest pain, palpitations and leg swelling.  Gastrointestinal: Negative for nausea and abdominal pain.  Genitourinary: Positive for frequency and flank pain. Negative for dysuria and hematuria.  Musculoskeletal: Negative for falls.  Skin: Negative for rash.  Neurological: Negative for loss of consciousness and headaches.  Endo/Heme/Allergies: Negative for environmental allergies.  Psychiatric/Behavioral: Negative for depression. The patient is not nervous/anxious.        Objective:    Physical Exam  Constitutional: She is oriented to person, place, and time. She appears well-developed and well-nourished. No distress.  HENT:  Head: Normocephalic and atraumatic.  Eyes: Conjunctivae are normal.  Neck: Neck supple. No thyromegaly present.  Cardiovascular: Normal rate, regular rhythm and normal heart sounds.   No murmur heard. Pulmonary/Chest: Effort normal and breath sounds normal. No respiratory distress.  Abdominal: Soft. Bowel sounds are normal. She exhibits no distension and no mass. There is no tenderness.  Musculoskeletal: She exhibits no edema.  Lymphadenopathy:    She has no cervical adenopathy.  Neurological: She is alert and oriented to person, place, and time.  Skin: Skin is warm and dry.  Psychiatric: She has a normal mood and affect. Her behavior is normal.    BP 118/82 mmHg  Pulse 65  Temp(Src) 98.5 F (36.9 C) (Oral)  Ht 5\' 7"  (1.702 m)  Wt 184 lb (83.462 kg)  BMI 28.81 kg/m2  SpO2 95%  LMP 04/19/2007 Wt Readings from Last 3 Encounters:  01/15/15 184 lb (83.462 kg)  01/01/15 185 lb (83.915 kg)  11/20/14 190 lb 2 oz (86.24 kg)     Lab Results  Component Value Date   WBC 7.1 11/20/2014   HGB 13.8 11/20/2014   HCT 41.1 11/20/2014   PLT 298.0 11/20/2014   GLUCOSE 94 01/12/2015   CHOL 182 01/13/2014   TRIG 47.0 01/13/2014   HDL 78.00 01/13/2014    LDLCALC 95 01/13/2014   ALT 20 01/12/2015   AST 20 01/12/2015   NA 143 01/12/2015   K 4.8 01/12/2015   CL 107 01/12/2015   CREATININE 0.74 01/12/2015   BUN 29* 01/12/2015   CO2 29 01/12/2015   TSH 1.10 11/20/2014   HGBA1C 5.3 11/07/2012    Lab Results  Component Value Date   TSH 1.10 11/20/2014   Lab Results  Component Value Date   WBC 7.1 11/20/2014   HGB 13.8 11/20/2014   HCT 41.1 11/20/2014   MCV 91.3 11/20/2014  PLT 298.0 11/20/2014   Lab Results  Component Value Date   NA 143 01/12/2015   K 4.8 01/12/2015   CO2 29 01/12/2015   GLUCOSE 94 01/12/2015   BUN 29* 01/12/2015   CREATININE 0.74 01/12/2015   BILITOT 0.5 01/12/2015   ALKPHOS 76 01/12/2015   AST 20 01/12/2015   ALT 20 01/12/2015   PROT 6.3 01/12/2015   ALBUMIN 4.1 01/12/2015   CALCIUM 9.4 01/12/2015   GFR 84.50 01/12/2015   Lab Results  Component Value Date   CHOL 182 01/13/2014   Lab Results  Component Value Date   HDL 78.00 01/13/2014   Lab Results  Component Value Date   LDLCALC 95 01/13/2014   Lab Results  Component Value Date   TRIG 47.0 01/13/2014   Lab Results  Component Value Date   CHOLHDL 2 01/13/2014   Lab Results  Component Value Date   HGBA1C 5.3 11/07/2012       Assessment & Plan:   GERD (gastroesophageal reflux disease) Avoid offending foods, start probiotics. Do not eat large meals in late evening and consider raising head of bed.    Hyperlipidemia, mixed Tolerating statin, encouraged heart healthy diet, avoid trans fats, minimize simple carbs and saturated fats. Increase exercise as tolerated  Kidney stone Mild, no symptoms now. Mild hematuria on recent UA. Repeat UA in6 weeks  Cervical cancer screening Due next year  Preventative health care Patient encouraged to maintain heart healthy diet, regular exercise, adequate sleep. Consider daily probiotics. Take medications as prescribed. Has Advanced Directives agrees to supply [place] with copies. Annual labs  ordered. Flu shot at work, needs Zostavax will given  Pedal edema Left ankle, mild, encouraged Jobst stockings, elevate feet, minimize sodium, try topical treatments. Has RLS symptoms in evening after a day of swelling. She declines meds will try topical Salon Pas gel and let us know if it changes  Breast cyst MGM ordered today  Personal history of colonic polyps Due for repeat. Will order referral for January 2016 at her request. Will get copy of last colonoscopy    I have discontinued Ms. Mccambridge's doxycycline. I am also having her maintain her aspirin, multivitamin with minerals, calcium carbonate, Vitamin D3, Krill Oil, Probiotic Product (PROBIOTIC DAILY PO), atorvastatin, furosemide, fluticasone, and HYDROcodone-homatropine.  No orders of the defined types were placed in this encounter.     Elizabeth Sauer, LPN

## 2015-01-15 NOTE — Patient Instructions (Addendum)
Jobst compression stockings 10-20 mmHg Salon Pas and Aspercreme gel, rub on areas before you lay down at night Research Requip and Mirapex for possible perscribed restless leg syndrome treatment   Preventive Care for Adults A healthy lifestyle and preventive care can promote health and wellness. Preventive health guidelines for women include the following key practices.  A routine yearly physical is a good way to check with your health care provider about your health and preventive screening. It is a chance to share any concerns and updates on your health and to receive a thorough exam.  Visit your dentist for a routine exam and preventive care every 6 months. Brush your teeth twice a day and floss once a day. Good oral hygiene prevents tooth decay and gum disease.  The frequency of eye exams is based on your age, health, family medical history, use of contact lenses, and other factors. Follow your health care provider's recommendations for frequency of eye exams.  Eat a healthy diet. Foods like vegetables, fruits, whole grains, low-fat dairy products, and lean protein foods contain the nutrients you need without too many calories. Decrease your intake of foods high in solid fats, added sugars, and salt. Eat the right amount of calories for you.Get information about a proper diet from your health care provider, if necessary.  Regular physical exercise is one of the most important things you can do for your health. Most adults should get at least 150 minutes of moderate-intensity exercise (any activity that increases your heart rate and causes you to sweat) each week. In addition, most adults need muscle-strengthening exercises on 2 or more days a week.  Maintain a healthy weight. The body mass index (BMI) is a screening tool to identify possible weight problems. It provides an estimate of body fat based on height and weight. Your health care provider can find your BMI and can help you achieve or  maintain a healthy weight.For adults 20 years and older:  A BMI below 18.5 is considered underweight.  A BMI of 18.5 to 24.9 is normal.  A BMI of 25 to 29.9 is considered overweight.  A BMI of 30 and above is considered obese.  Maintain normal blood lipids and cholesterol levels by exercising and minimizing your intake of saturated fat. Eat a balanced diet with plenty of fruit and vegetables. Blood tests for lipids and cholesterol should begin at age 93 and be repeated every 5 years. If your lipid or cholesterol levels are high, you are over 50, or you are at high risk for heart disease, you may need your cholesterol levels checked more frequently.Ongoing high lipid and cholesterol levels should be treated with medicines if diet and exercise are not working.  If you smoke, find out from your health care provider how to quit. If you do not use tobacco, do not start.  Lung cancer screening is recommended for adults aged 40-80 years who are at high risk for developing lung cancer because of a history of smoking. A yearly low-dose CT scan of the lungs is recommended for people who have at least a 30-pack-year history of smoking and are a current smoker or have quit within the past 15 years. A pack year of smoking is smoking an average of 1 pack of cigarettes a day for 1 year (for example: 1 pack a day for 30 years or 2 packs a day for 15 years). Yearly screening should continue until the smoker has stopped smoking for at least 15 years. Yearly screening  should be stopped for people who develop a health problem that would prevent them from having lung cancer treatment.  If you are pregnant, do not drink alcohol. If you are breastfeeding, be very cautious about drinking alcohol. If you are not pregnant and choose to drink alcohol, do not have more than 1 drink per day. One drink is considered to be 12 ounces (355 mL) of beer, 5 ounces (148 mL) of wine, or 1.5 ounces (44 mL) of liquor.  Avoid use of  street drugs. Do not share needles with anyone. Ask for help if you need support or instructions about stopping the use of drugs.  High blood pressure causes heart disease and increases the risk of stroke. Your blood pressure should be checked at least every 1 to 2 years. Ongoing high blood pressure should be treated with medicines if weight loss and exercise do not work.  If you are 34-44 years old, ask your health care provider if you should take aspirin to prevent strokes.  Diabetes screening involves taking a blood sample to check your fasting blood sugar level. This should be done once every 3 years, after age 67, if you are within normal weight and without risk factors for diabetes. Testing should be considered at a younger age or be carried out more frequently if you are overweight and have at least 1 risk factor for diabetes.  Breast cancer screening is essential preventive care for women. You should practice "breast self-awareness." This means understanding the normal appearance and feel of your breasts and may include breast self-examination. Any changes detected, no matter how small, should be reported to a health care provider. Women in their 68s and 30s should have a clinical breast exam (CBE) by a health care provider as part of a regular health exam every 1 to 3 years. After age 18, women should have a CBE every year. Starting at age 60, women should consider having a mammogram (breast X-ray test) every year. Women who have a family history of breast cancer should talk to their health care provider about genetic screening. Women at a high risk of breast cancer should talk to their health care providers about having an MRI and a mammogram every year.  Breast cancer gene (BRCA)-related cancer risk assessment is recommended for women who have family members with BRCA-related cancers. BRCA-related cancers include breast, ovarian, tubal, and peritoneal cancers. Having family members with these  cancers may be associated with an increased risk for harmful changes (mutations) in the breast cancer genes BRCA1 and BRCA2. Results of the assessment will determine the need for genetic counseling and BRCA1 and BRCA2 testing.  Routine pelvic exams to screen for cancer are no longer recommended for nonpregnant women who are considered low risk for cancer of the pelvic organs (ovaries, uterus, and vagina) and who do not have symptoms. Ask your health care provider if a screening pelvic exam is right for you.  If you have had past treatment for cervical cancer or a condition that could lead to cancer, you need Pap tests and screening for cancer for at least 20 years after your treatment. If Pap tests have been discontinued, your risk factors (such as having a new sexual partner) need to be reassessed to determine if screening should be resumed. Some women have medical problems that increase the chance of getting cervical cancer. In these cases, your health care provider may recommend more frequent screening and Pap tests.  The HPV test is an additional test that  may be used for cervical cancer screening. The HPV test looks for the virus that can cause the cell changes on the cervix. The cells collected during the Pap test can be tested for HPV. The HPV test could be used to screen women aged 90 years and older, and should be used in women of any age who have unclear Pap test results. After the age of 49, women should have HPV testing at the same frequency as a Pap test.  Colorectal cancer can be detected and often prevented. Most routine colorectal cancer screening begins at the age of 52 years and continues through age 76 years. However, your health care provider may recommend screening at an earlier age if you have risk factors for colon cancer. On a yearly basis, your health care provider may provide home test kits to check for hidden blood in the stool. Use of a small camera at the end of a tube, to  directly examine the colon (sigmoidoscopy or colonoscopy), can detect the earliest forms of colorectal cancer. Talk to your health care provider about this at age 54, when routine screening begins. Direct exam of the colon should be repeated every 5-10 years through age 50 years, unless early forms of pre-cancerous polyps or small growths are found.  People who are at an increased risk for hepatitis B should be screened for this virus. You are considered at high risk for hepatitis B if:  You were born in a country where hepatitis B occurs often. Talk with your health care provider about which countries are considered high risk.  Your parents were born in a high-risk country and you have not received a shot to protect against hepatitis B (hepatitis B vaccine).  You have HIV or AIDS.  You use needles to inject street drugs.  You live with, or have sex with, someone who has hepatitis B.  You get hemodialysis treatment.  You take certain medicines for conditions like cancer, organ transplantation, and autoimmune conditions.  Hepatitis C blood testing is recommended for all people born from [birth year] through 1963 and any individual with known risks for hepatitis C.  Practice safe sex. Use condoms and avoid high-risk sexual practices to reduce the spread of sexually transmitted infections (STIs). STIs include gonorrhea, chlamydia, syphilis, trichomonas, herpes, HPV, and human immunodeficiency virus (HIV). Herpes, HIV, and HPV are viral illnesses that have no cure. They can result in disability, cancer, and death.  You should be screened for sexually transmitted illnesses (STIs) including gonorrhea and chlamydia if:  You are sexually active and are younger than 24 years.  You are older than 24 years and your health care provider tells you that you are at risk for this type of infection.  Your sexual activity has changed since you were last screened and you are at an increased risk for chlamydia or  gonorrhea. Ask your health care provider if you are at risk.  If you are at risk of being infected with HIV, it is recommended that you take a prescription medicine daily to prevent HIV infection. This is called preexposure prophylaxis (PrEP). You are considered at risk if:  You are a heterosexual woman, are sexually active, and are at increased risk for HIV infection.  You take drugs by injection.  You are sexually active with a partner who has HIV.  Talk with your health care provider about whether you are at high risk of being infected with HIV. If you choose to begin PrEP, you should first be  tested for HIV. You should then be tested every 3 months for as long as you are taking PrEP.  Osteoporosis is a disease in which the bones lose minerals and strength with aging. This can result in serious bone fractures or breaks. The risk of osteoporosis can be identified using a bone density scan. Women ages 23 years and over and women at risk for fractures or osteoporosis should discuss screening with their health care providers. Ask your health care provider whether you should take a calcium supplement or vitamin D to reduce the rate of osteoporosis.  Menopause can be associated with physical symptoms and risks. Hormone replacement therapy is available to decrease symptoms and risks. You should talk to your health care provider about whether hormone replacement therapy is right for you.  Use sunscreen. Apply sunscreen liberally and repeatedly throughout the day. You should seek shade when your shadow is shorter than you. Protect yourself by wearing long sleeves, pants, a wide-brimmed hat, and sunglasses year round, whenever you are outdoors.  Once a month, do a whole body skin exam, using a mirror to look at the skin on your back. Tell your health care provider of new moles, moles that have irregular borders, moles that are larger than a pencil eraser, or moles that have changed in shape or  color.  Stay current with required vaccines (immunizations).  Influenza vaccine. All adults should be immunized every year.  Tetanus, diphtheria, and acellular pertussis (Td, Tdap) vaccine. Pregnant women should receive 1 dose of Tdap vaccine during each pregnancy. The dose should be obtained regardless of the length of time since the last dose. Immunization is preferred during the 27th-36th week of gestation. An adult who has not previously received Tdap or who does not know her vaccine status should receive 1 dose of Tdap. This initial dose should be followed by tetanus and diphtheria toxoids (Td) booster doses every 10 years. Adults with an unknown or incomplete history of completing a 3-dose immunization series with Td-containing vaccines should begin or complete a primary immunization series including a Tdap dose. Adults should receive a Td booster every 10 years.  Varicella vaccine. An adult without evidence of immunity to varicella should receive 2 doses or a second dose if she has previously received 1 dose. Pregnant females who do not have evidence of immunity should receive the first dose after pregnancy. This first dose should be obtained before leaving the health care facility. The second dose should be obtained 4-8 weeks after the first dose.  Human papillomavirus (HPV) vaccine. Females aged 13-26 years who have not received the vaccine previously should obtain the 3-dose series. The vaccine is not recommended for use in pregnant females. However, pregnancy testing is not needed before receiving a dose. If a female is found to be pregnant after receiving a dose, no treatment is needed. In that case, the remaining doses should be delayed until after the pregnancy. Immunization is recommended for any person with an immunocompromised condition through the age of 97 years if she did not get any or all doses earlier. During the 3-dose series, the second dose should be obtained 4-8 weeks after the  first dose. The third dose should be obtained 24 weeks after the first dose and 16 weeks after the second dose.  Zoster vaccine. One dose is recommended for adults aged 44 years or older unless certain conditions are present.  Measles, mumps, and rubella (MMR) vaccine. Adults born before [birth year] generally are considered immune to measles and  mumps. Adults born in [birth year] or later should have 1 or more doses of MMR vaccine unless there is a contraindication to the vaccine or there is laboratory evidence of immunity to each of the three diseases. A routine second dose of MMR vaccine should be obtained at least 28 days after the first dose for students attending postsecondary schools, health care workers, or international travelers. People who received inactivated measles vaccine or an unknown type of measles vaccine during 1963-1967 should receive 2 doses of MMR vaccine. People who received inactivated mumps vaccine or an unknown type of mumps vaccine before 1979 and are at high risk for mumps infection should consider immunization with 2 doses of MMR vaccine. For females of childbearing age, rubella immunity should be determined. If there is no evidence of immunity, females who are not pregnant should be vaccinated. If there is no evidence of immunity, females who are pregnant should delay immunization until after pregnancy. Unvaccinated health care workers born before [birth year] who lack laboratory evidence of measles, mumps, or rubella immunity or laboratory confirmation of disease should consider measles and mumps immunization with 2 doses of MMR vaccine or rubella immunization with 1 dose of MMR vaccine.  Pneumococcal 13-valent conjugate (PCV13) vaccine. When indicated, a person who is uncertain of her immunization history and has no record of immunization should receive the PCV13 vaccine. An adult aged 30 years or older who has certain medical conditions and has not been previously immunized should receive 1 dose of  PCV13 vaccine. This PCV13 should be followed with a dose of pneumococcal polysaccharide (PPSV23) vaccine. The PPSV23 vaccine dose should be obtained at least 8 weeks after the dose of PCV13 vaccine. An adult aged 79 years or older who has certain medical conditions and previously received 1 or more doses of PPSV23 vaccine should receive 1 dose of PCV13. The PCV13 vaccine dose should be obtained 1 or more years after the last PPSV23 vaccine dose.  Pneumococcal polysaccharide (PPSV23) vaccine. When PCV13 is also indicated, PCV13 should be obtained first. All adults aged 70 years and older should be immunized. An adult younger than age 51 years who has certain medical conditions should be immunized. Any person who resides in a nursing home or long-term care facility should be immunized. An adult smoker should be immunized. People with an immunocompromised condition and certain other conditions should receive both PCV13 and PPSV23 vaccines. People with human immunodeficiency virus (HIV) infection should be immunized as soon as possible after diagnosis. Immunization during chemotherapy or radiation therapy should be avoided. Routine use of PPSV23 vaccine is not recommended for American Indians, [place]atives, or people younger than 65 years unless there are medical conditions that require PPSV23 vaccine. When indicated, people who have unknown immunization and have no record of immunization should receive PPSV23 vaccine. One-time revaccination 5 years after the first dose of PPSV23 is recommended for people aged 19-64 years who have chronic kidney failure, nephrotic syndrome, asplenia, or immunocompromised conditions. People who received 1-2 doses of PPSV23 before age 9 years should receive another dose of PPSV23 vaccine at age 2 years or later if at least 5 years have passed since the previous dose. Doses of PPSV23 are not needed for people immunized with PPSV23 at or after age 20 years.  Meningococcal vaccine.  Adults with asplenia or persistent complement component deficiencies should receive 2 doses of quadrivalent meningococcal conjugate (MenACWY-D) vaccine. The doses should be obtained at least 2 months apart. Microbiologists working with certain meningococcal bacteria, [employer]  recruits, people at risk during an outbreak, and people who travel to or live in countries with a high rate of meningitis should be immunized. A first-year college student up through age 30 years who is living in a residence hall should receive a dose if she did not receive a dose on or after her 16th birthday. Adults who have certain high-risk conditions should receive one or more doses of vaccine.  Hepatitis A vaccine. Adults who wish to be protected from this disease, have certain high-risk conditions, work with hepatitis A-infected animals, work in hepatitis A research labs, or travel to or work in countries with a high rate of hepatitis A should be immunized. Adults who were previously unvaccinated and who anticipate close contact with an international adoptee during the first 60 days after arrival in the [place] from a country with a high rate of hepatitis A should be immunized.  Hepatitis B vaccine. Adults who wish to be protected from this disease, have certain high-risk conditions, may be exposed to blood or other infectious body fluids, are household contacts or sex partners of hepatitis B positive people, are clients or workers in certain care facilities, or travel to or work in countries with a high rate of hepatitis B should be immunized.  Haemophilus influenzae type b (Hib) vaccine. A previously unvaccinated person with asplenia or sickle cell disease or having a scheduled splenectomy should receive 1 dose of Hib vaccine. Regardless of previous immunization, a recipient of a hematopoietic stem cell transplant should receive a 3-dose series 6-12 months after her successful transplant. Hib vaccine is not recommended for  adults with HIV infection. Preventive Services / Frequency Ages 33 to 68 years  Blood pressure check.** / Every 1 to 2 years.  Lipid and cholesterol check.** / Every 5 years beginning at age 36.  Clinical breast exam.** / Every 3 years for women in their 5s and 15s.  BRCA-related cancer risk assessment.** / For women who have family members with a BRCA-related cancer (breast, ovarian, tubal, or peritoneal cancers).  Pap test.** / Every 2 years from ages 75 through 61. Every 3 years starting at age 24 through age 53 or 18 with a history of 3 consecutive normal Pap tests.  HPV screening.** / Every 3 years from ages 62 through ages 1 to 64 with a history of 3 consecutive normal Pap tests.  Hepatitis C blood test.** / For any individual with known risks for hepatitis C.  Skin self-exam. / Monthly.  Influenza vaccine. / Every year.  Tetanus, diphtheria, and acellular pertussis (Tdap, Td) vaccine.** / Consult your health care provider. Pregnant women should receive 1 dose of Tdap vaccine during each pregnancy. 1 dose of Td every 10 years.  Varicella vaccine.** / Consult your health care provider. Pregnant females who do not have evidence of immunity should receive the first dose after pregnancy.  HPV vaccine. / 3 doses over 6 months, if 48 and younger. The vaccine is not recommended for use in pregnant females. However, pregnancy testing is not needed before receiving a dose.  Measles, mumps, rubella (MMR) vaccine.** / You need at least 1 dose of MMR if you were born in 1957 or later. You may also need a 2nd dose. For females of childbearing age, rubella immunity should be determined. If there is no evidence of immunity, females who are not pregnant should be vaccinated. If there is no evidence of immunity, females who are pregnant should delay immunization until after pregnancy.  Pneumococcal 13-valent  conjugate (PCV13) vaccine.** / Consult your health care provider.  Pneumococcal  polysaccharide (PPSV23) vaccine.** / 1 to 2 doses if you smoke cigarettes or if you have certain conditions.  Meningococcal vaccine.** / 1 dose if you are age 28 to 66 years and a Market researcher living in a residence hall, or have one of several medical conditions, you need to get vaccinated against meningococcal disease. You may also need additional booster doses.  Hepatitis A vaccine.** / Consult your health care provider.  Hepatitis B vaccine.** / Consult your health care provider.  Haemophilus influenzae type b (Hib) vaccine.** / Consult your health care provider. Ages 27 to 92 years  Blood pressure check.** / Every 1 to 2 years.  Lipid and cholesterol check.** / Every 5 years beginning at age 15 years.  Lung cancer screening. / Every year if you are aged 57-80 years and have a 30-pack-year history of smoking and currently smoke or have quit within the past 15 years. Yearly screening is stopped once you have quit smoking for at least 15 years or develop a health problem that would prevent you from having lung cancer treatment.  Clinical breast exam.** / Every year after age 42 years.  BRCA-related cancer risk assessment.** / For women who have family members with a BRCA-related cancer (breast, ovarian, tubal, or peritoneal cancers).  Mammogram.** / Every year beginning at age 70 years and continuing for as long as you are in good health. Consult with your health care provider.  Pap test.** / Every 3 years starting at age 41 years through age 81 or 46 years with a history of 3 consecutive normal Pap tests.  HPV screening.** / Every 3 years from ages 77 years through ages 71 to 38 years with a history of 3 consecutive normal Pap tests.  Fecal occult blood test (FOBT) of stool. / Every year beginning at age 90 years and continuing until age 58 years. You may not need to do this test if you get a colonoscopy every 10 years.  Flexible sigmoidoscopy or colonoscopy.** / Every 5  years for a flexible sigmoidoscopy or every 10 years for a colonoscopy beginning at age 42 years and continuing until age 65 years.  Hepatitis C blood test.** / For all people born from [birth year] through 1963 and any individual with known risks for hepatitis C.  Skin self-exam. / Monthly.  Influenza vaccine. / Every year.  Tetanus, diphtheria, and acellular pertussis (Tdap/Td) vaccine.** / Consult your health care provider. Pregnant women should receive 1 dose of Tdap vaccine during each pregnancy. 1 dose of Td every 10 years.  Varicella vaccine.** / Consult your health care provider. Pregnant females who do not have evidence of immunity should receive the first dose after pregnancy.  Zoster vaccine.** / 1 dose for adults aged 19 years or older.  Measles, mumps, rubella (MMR) vaccine.** / You need at least 1 dose of MMR if you were born in 1955 or later. You may also need a 2nd dose. For females of childbearing age, rubella immunity should be determined. If there is no evidence of immunity, females who are not pregnant should be vaccinated. If there is no evidence of immunity, females who are pregnant should delay immunization until after pregnancy.  Pneumococcal 13-valent conjugate (PCV13) vaccine.** / Consult your health care provider.  Pneumococcal polysaccharide (PPSV23) vaccine.** / 1 to 2 doses if you smoke cigarettes or if you have certain conditions.  Meningococcal vaccine.** / Consult your health care provider.  Hepatitis  A vaccine.** / Consult your health care provider.  Hepatitis B vaccine.** / Consult your health care provider.  Haemophilus influenzae type b (Hib) vaccine.** / Consult your health care provider. Ages 65 years and over  Blood pressure check.** / Every 1 to 2 years.  Lipid and cholesterol check.** / Every 5 years beginning at age 43 years.  Lung cancer screening. / Every year if you are aged 97-80 years and have a 30-pack-year history of smoking and currently  smoke or have quit within the past 15 years. Yearly screening is stopped once you have quit smoking for at least 15 years or develop a health problem that would prevent you from having lung cancer treatment.  Clinical breast exam.** / Every year after age 76 years.  BRCA-related cancer risk assessment.** / For women who have family members with a BRCA-related cancer (breast, ovarian, tubal, or peritoneal cancers).  Mammogram.** / Every year beginning at age 14 years and continuing for as long as you are in good health. Consult with your health care provider.  Pap test.** / Every 3 years starting at age 4 years through age 75 or 32 years with 3 consecutive normal Pap tests. Testing can be stopped between 65 and 70 years with 3 consecutive normal Pap tests and no abnormal Pap or HPV tests in the past 10 years.  HPV screening.** / Every 3 years from ages 61 years through ages 91 or 73 years with a history of 3 consecutive normal Pap tests. Testing can be stopped between 65 and 70 years with 3 consecutive normal Pap tests and no abnormal Pap or HPV tests in the past 10 years.  Fecal occult blood test (FOBT) of stool. / Every year beginning at age 37 years and continuing until age 9 years. You may not need to do this test if you get a colonoscopy every 10 years.  Flexible sigmoidoscopy or colonoscopy.** / Every 5 years for a flexible sigmoidoscopy or every 10 years for a colonoscopy beginning at age 73 years and continuing until age 65 years.  Hepatitis C blood test.** / For all people born from [birth year] through 1963 and any individual with known risks for hepatitis C.  Osteoporosis screening.** / A one-time screening for women ages 52 years and over and women at risk for fractures or osteoporosis.  Skin self-exam. / Monthly.  Influenza vaccine. / Every year.  Tetanus, diphtheria, and acellular pertussis (Tdap/Td) vaccine.** / 1 dose of Td every 10 years.  Varicella vaccine.** / Consult your  health care provider.  Zoster vaccine.** / 1 dose for adults aged 61 years or older.  Pneumococcal 13-valent conjugate (PCV13) vaccine.** / Consult your health care provider.  Pneumococcal polysaccharide (PPSV23) vaccine.** / 1 dose for all adults aged 41 years and older.  Meningococcal vaccine.** / Consult your health care provider.  Hepatitis A vaccine.** / Consult your health care provider.  Hepatitis B vaccine.** / Consult your health care provider.  Haemophilus influenzae type b (Hib) vaccine.** / Consult your health care provider. ** Family history and personal history of risk and conditions may change your health care provider's recommendations. Document Released: 05/31/2001 Document Revised: 08/19/2013 Document Reviewed: 08/30/2010 [facility] Patient Information 2015 [place], [place]. This information is not intended to replace advice given to you by your health care provider. Make sure you discuss any questions you have with your health care provider.

## 2015-01-15 NOTE — Assessment & Plan Note (Signed)
Tolerating statin, encouraged heart healthy diet, avoid trans fats, minimize simple carbs and saturated fats. Increase exercise as tolerated 

## 2015-01-15 NOTE — Assessment & Plan Note (Signed)
Mild, no symptoms now. Mild hematuria on recent UA. Repeat UA in6 weeks

## 2015-01-15 NOTE — Assessment & Plan Note (Signed)
MGM ordered today

## 2015-01-15 NOTE — Assessment & Plan Note (Signed)
Due for repeat. Will order referral for January 2016 at her request. Will get copy of last colonoscopy

## 2015-01-15 NOTE — Assessment & Plan Note (Addendum)
Patient encouraged to maintain heart healthy diet, regular exercise, adequate sleep. Consider daily probiotics. Take medications as prescribed. Has [employer] Direct[employer] agrees to supply [place] with copies. Annual labs ordered. Flu shot at work, needs Zostavax will given. Referred for screening colonoscopy

## 2015-01-16 LAB — HIV ANTIBODY (ROUTINE TESTING W REFLEX): HIV: NONREACTIVE

## 2015-01-16 LAB — HEPATITIS C ANTIBODY: HCV Ab: NEGATIVE

## 2015-02-09 ENCOUNTER — Encounter: Payer: Self-pay | Admitting: Cardiovascular Disease

## 2015-02-09 ENCOUNTER — Ambulatory Visit (INDEPENDENT_AMBULATORY_CARE_PROVIDER_SITE_OTHER): Payer: PRIVATE HEALTH INSURANCE | Admitting: Cardiovascular Disease

## 2015-02-09 VITALS — BP 100/80 | HR 65 | Ht 67.0 in | Wt 185.2 lb

## 2015-02-09 DIAGNOSIS — R600 Localized edema: Secondary | ICD-10-CM | POA: Diagnosis not present

## 2015-02-09 DIAGNOSIS — Z720 Tobacco use: Secondary | ICD-10-CM

## 2015-02-09 DIAGNOSIS — E785 Hyperlipidemia, unspecified: Secondary | ICD-10-CM | POA: Diagnosis not present

## 2015-02-09 DIAGNOSIS — I701 Atherosclerosis of renal artery: Secondary | ICD-10-CM

## 2015-02-09 NOTE — Patient Instructions (Signed)
Medication Instructions:  Your physician recommends that you continue on your current medications as directed. Please refer to the Current Medication list given to you today.   Labwork: none  Testing/Procedures: Your physician has requested that you have a renal artery duplex. During this test, an ultrasound is used to evaluate blood flow to the kidneys. Allow one hour for this exam. Do not eat after midnight the day before and avoid carbonated beverages. Take your medications as you usually do.  Your physician has requested that you have a lower or upper extremity venous duplex. This test is an ultrasound of the veins in the legs or arms. It looks at venous blood flow that carries blood from the heart to the legs or arms. Allow one hour for a Lower Venous exam. Allow thirty minutes for an Upper Venous exam. There are no restrictions or special instructions.     Follow-Up: Your physician wants you to follow-up in: 12 months.  You will receive a reminder letter in the mail two months in advance. If you don't receive a letter, please call our office to schedule the follow-up appointment.

## 2015-02-09 NOTE — Progress Notes (Signed)
Chief Complaint  Patient presents with  . Follow-up    hyperlipidemia and renal artery stenosis hyperlipidemia and renal artery stenosis . PT C/O Swelling in left leg    History of Present Illness: 64 yo WF with history of hyperlipidemia and renal artery stenosis with normal BP and normal renal function here today for PV follow up. She has been followed in the past by Dr. Gwenlyn Found. I last saw her in September 2014. Most recent dopplers in August 2014 showed 50% right renal artery stenosis and no evidence of Left renal artery stenosis.  Her ABIs were normal on the right and left in December 2013. She has had a normal stress Myoview in September 2014.   She is here for follow up. She has no leg pain with walking. She does have left LE edema. This gets better at night but there has been some swelling persistent in the left leg for 2-3 months. She does not exercise. No chest pain or SOB.   Primary Care Physician: Charlett Blake   Past Medical History  Diagnosis Date  . Hiatal hernia   . Breast cyst     dense  . Chicken pox as a kid  . Hyperlipidemia   . GERD (gastroesophageal reflux disease)   . Renal artery stenosis (HCC)     kidney stone  . Colon polyp   . Preventative health care 10/04/2012  . Other and unspecified hyperlipidemia 10/04/2012  . Kidney stone 10/04/2012  . History of shingles 10/04/2012  . Anxiety and depression 11/16/2012  . History of colonic polyps 01/15/2015    Past Surgical History  Procedure Laterality Date  . Polpypectomy  08/2004  . Cyst removed off left side  1968    benign, skin    Current Outpatient Prescriptions  Medication Sig Dispense Refill  . aspirin 81 MG EC tablet Take 81 mg by mouth daily.      Marland Kitchen atorvastatin (LIPITOR) 10 MG tablet Take 1 tablet (10 mg total) by mouth daily. 30 tablet 6  . calcium carbonate (TUMS - DOSED IN MG ELEMENTAL CALCIUM) 500 MG chewable tablet Chew 1 tablet by mouth daily.    . Cholecalciferol (VITAMIN D3) 5000 UNITS CAPS Take 1  capsule by mouth daily.    . furosemide (LASIX) 20 MG tablet Take 1 tablet (20 mg total) by mouth daily as needed for fluid or edema (sob/Weight gain>3# in 24 hours). 30 tablet 2  . Krill Oil CAPS Take 1 capsule by mouth daily.     . Multiple Vitamins-Minerals (MULTIVITAMIN WITH MINERALS) tablet Take 1 tablet by mouth daily.      . Probiotic Product (PROBIOTIC DAILY PO) Take 1 capsule by mouth daily.      No current facility-administered medications for this visit.    No Known Allergies  Social History   Social History  . Marital Status: Single    Spouse Name: N/A  . Number of Children: N/A  . Years of Education: N/A   Occupational History  . Not on file.   Social History Main Topics  . Smoking status: Former Smoker -- 0.50 packs/day for 10 years    Types: Cigarettes    Start date: 04/18/1988  . Smokeless tobacco: Never Used  . Alcohol Use: Yes     Comment: occasionally  . Drug Use: No  . Sexual Activity:    Partners: Male   Other Topics Concern  . Not on file   Social History Narrative    Family History  Problem  Relation Age of Onset  . Arthritis Mother   . Heart disease Mother   . Cancer Mother 51    breast, sarcoma x 3 bouts in past, under righ arm  . Hyperlipidemia Mother   . Ulcers Father   . Hypertension Father   . Arthritis Father   . Heart disease Father     CHF  . Macular degeneration Father   . Heart disease Maternal Grandmother   . Heart disease Paternal Grandmother   . Heart disease Paternal Grandfather   . Ulcers Paternal Grandfather   . Cancer Brother     pancreatic  . Heart disease Maternal Grandfather   . Heart attack Maternal Grandfather   . Pneumonia Sister   . Arthritis Sister     rheumatoid    Review of Systems:  As stated in the HPI and otherwise negative.   BP 100/80 mmHg  Pulse 65  Ht 5\' 7"  (1.702 m)  Wt 185 lb 3.2 oz (84.006 kg)  BMI 29.00 kg/m2  SpO2 98%  LMP 04/19/2007  Physical Examination: General: Well developed,  well nourished, NAD HEENT: OP clear, mucus membranes moist SKIN: warm, dry. No rashes. Neuro: No focal deficits Musculoskeletal: Muscle strength 5/5 all ext Psychiatric: Mood and affect normal Neck: No JVD, no carotid bruits, no thyromegaly, no lymphadenopathy. Lungs:Clear bilaterally, no wheezes, rhonci, crackles Cardiovascular: Regular rate and rhythm. No murmurs, gallops or rubs. Abdomen:Soft. Bowel sounds present. Non-tender.  Extremities: Trace left lower extremity edema. No right LE edema. Pulses are 2 + in the bilateral DP/P  Renal artery dopplers 11/22/12: Normal caliber aorta. Stable 1-59% right renal artery stenosis. Normal left renal artery. Normal bilateral kidney size.   EKG:  EKG is ordered today. The ekg ordered today demonstrates Sinus, rate 65 bpm. PAC.  Recent Labs: 01/12/2015: ALT 20; BUN 29*; Creatinine, Ser 0.74; Potassium 4.8; Sodium 143 01/15/2015: Hemoglobin 14.4; Platelets 323.0; TSH 1.23   Lipid Panel    Component Value Date/Time   CHOL 160 01/15/2015 0916   TRIG 53.0 01/15/2015 0916   HDL 67.20 01/15/2015 0916   CHOLHDL 2 01/15/2015 0916   VLDL 10.6 01/15/2015 0916   LDLCALC 83 01/15/2015 0916     Wt Readings from Last 3 Encounters:  02/09/15 185 lb 3.2 oz (84.006 kg)  01/15/15 184 lb (83.462 kg)  01/01/15 185 lb (83.915 kg)     Other studies Reviewed: Additional studies/ records that were reviewed today include: . Review of the above records demonstrates:    Assessment and Plan:   1. PAD: Right renal artery stenosis stable by doppler in August 2014. Right kidney size is stable.  Will repeat renal artery dopplers now.   2. Left leg swelling/edema: Will arrange venous u/s to exclude DVT  3. HLD: on statin  4. 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   Current medicines are reviewed at length with the patient today.  The patient does not have concerns regarding medicines.  The following changes have been made:  no  change  Labs/ tests ordered today include:   Orders Placed This Encounter  Procedures  . EKG 12-Lead    Disposition:   FU with me in 12  months  Signed, Lauree Chandler, MD 02/09/2015 8:55 AM    [facility] [place], [place], [place]  [address] Phone: [phone]; Fax: [phone]

## 2015-03-02 ENCOUNTER — Telehealth: Payer: Self-pay | Admitting: *Deleted

## 2015-03-02 ENCOUNTER — Ambulatory Visit (HOSPITAL_BASED_OUTPATIENT_CLINIC_OR_DEPARTMENT_OTHER)
Admission: RE | Admit: 2015-03-02 | Discharge: 2015-03-02 | Disposition: A | Discharge status: Home / Self Care | Payer: PRIVATE HEALTH INSURANCE | Source: Ambulatory Visit | Attending: Cardiology | Admitting: Cardiology

## 2015-03-02 ENCOUNTER — Ambulatory Visit (HOSPITAL_COMMUNITY)
Admission: RE | Admit: 2015-03-02 | Discharge: 2015-03-02 | Disposition: A | Discharge status: Home / Self Care | Payer: PRIVATE HEALTH INSURANCE | Source: Ambulatory Visit | Attending: Cardiology | Admitting: Cardiology

## 2015-03-02 DIAGNOSIS — I701 Atherosclerosis of renal artery: Secondary | ICD-10-CM | POA: Insufficient documentation

## 2015-03-02 DIAGNOSIS — R600 Localized edema: Secondary | ICD-10-CM | POA: Insufficient documentation

## 2015-03-02 DIAGNOSIS — E785 Hyperlipidemia, unspecified: Secondary | ICD-10-CM | POA: Insufficient documentation

## 2015-03-02 DIAGNOSIS — R2242 Localized swelling, mass and lump, left lower limb: Secondary | ICD-10-CM | POA: Insufficient documentation

## 2015-03-02 NOTE — Telephone Encounter (Signed)
Notified of LE doppler results. 

## 2015-03-02 NOTE — Telephone Encounter (Signed)
Per Katharine Look at [facility]:  LE venous doppler done today negative for DVT, Baker's cyst behind left knee.   I will forward to Dr Angelena Form for review.

## 2015-03-02 NOTE — Telephone Encounter (Signed)
Lm to call back re: LE venous doppler results.

## 2015-03-02 NOTE — Telephone Encounter (Signed)
Can we let her know that the doppler is negative for DVT? Thanks, chris

## 2015-05-06 MED FILL — ATORVASTATIN 10 MG TABLET: 10 | 30 days supply | Qty: 30 | Fill #4

## 2015-05-12 ENCOUNTER — Ambulatory Visit
Admission: RE | Admit: 2015-05-12 | Discharge: 2015-05-12 | Disposition: A | Discharge status: Home / Self Care | Payer: PRIVATE HEALTH INSURANCE | Source: Ambulatory Visit | Attending: Family Medicine | Admitting: Family Medicine

## 2015-05-12 DIAGNOSIS — Z1239 Encounter for other screening for malignant neoplasm of breast: Secondary | ICD-10-CM

## 2015-06-09 ENCOUNTER — Telehealth: Payer: Self-pay | Admitting: Family Medicine

## 2015-06-09 NOTE — Telephone Encounter (Signed)
Documented in health maintenance and immunizations.

## 2015-06-09 NOTE — Telephone Encounter (Signed)
Patient received flu shot 01/2016 with employer [employer]

## 2015-06-22 MED FILL — ATORVASTATIN 10 MG TABLET: 10 | 30 days supply | Qty: 30 | Fill #5

## 2015-08-24 MED FILL — BISACODYL 5 MG TABLET EC: 5 | 1 days supply | Qty: 4 | Fill #0

## 2015-08-24 MED FILL — GAVILYTE-N SOLUTION: 420 | 7 days supply | Qty: 4000 | Fill #0

## 2015-08-28 LAB — HM COLONOSCOPY

## 2015-09-01 MED FILL — ATORVASTATIN 10 MG TABLET: 10 | 30 days supply | Qty: 30 | Fill #6

## 2015-09-03 ENCOUNTER — Encounter: Payer: Self-pay | Admitting: Family Medicine

## 2015-10-22 ENCOUNTER — Other Ambulatory Visit: Payer: Self-pay | Admitting: Family Medicine

## 2015-10-22 MED FILL — ATORVASTATIN 10 MG TABLET: 10 | 30 days supply | Qty: 30 | Fill #0

## 2015-12-16 MED FILL — ATORVASTATIN 10 MG TABLET: 10 | 30 days supply | Qty: 30 | Fill #1

## 2016-01-18 MED FILL — ATORVASTATIN 10 MG TABLET: 10 | 30 days supply | Qty: 30 | Fill #2

## 2016-03-18 ENCOUNTER — Other Ambulatory Visit: Payer: Self-pay | Admitting: Cardiovascular Disease

## 2016-03-18 DIAGNOSIS — I10 Essential (primary) hypertension: Secondary | ICD-10-CM

## 2016-03-21 ENCOUNTER — Ambulatory Visit (INDEPENDENT_AMBULATORY_CARE_PROVIDER_SITE_OTHER): Payer: PRIVATE HEALTH INSURANCE | Admitting: Cardiovascular Disease

## 2016-03-21 ENCOUNTER — Encounter (INDEPENDENT_AMBULATORY_CARE_PROVIDER_SITE_OTHER): Payer: Self-pay

## 2016-03-21 ENCOUNTER — Encounter: Payer: Self-pay | Admitting: Cardiovascular Disease

## 2016-03-21 VITALS — BP 118/76 | HR 59 | Ht 67.0 in | Wt 186.1 lb

## 2016-03-21 DIAGNOSIS — I701 Atherosclerosis of renal artery: Secondary | ICD-10-CM

## 2016-03-21 DIAGNOSIS — E7800 Pure hypercholesterolemia, unspecified: Secondary | ICD-10-CM | POA: Diagnosis not present

## 2016-03-21 NOTE — Patient Instructions (Signed)
Medication Instructions:  Your physician recommends that you continue on your current medications as directed. Please refer to the Current Medication list given to you today.   Labwork: none  Testing/Procedures: Your physician has requested that you have a renal artery duplex. During this test, an ultrasound is used to evaluate blood flow to the kidneys. Allow one hour for this exam. Do not eat after midnight the day before and avoid carbonated beverages. Take your medications as you usually do. Scheduled for December 11,2017    Follow-Up: Your physician wants you to follow-up in: 2 years.  You will receive a reminder letter in the mail two months in advance. If you don't receive a letter, please call our office to schedule the follow-up appointment.   Any Other Special Instructions Will Be Listed Below (If Applicable).     If you need a refill on your cardiac medications before your next appointment, please call your pharmacy.

## 2016-03-21 NOTE — Progress Notes (Signed)
Chief Complaint  Patient presents with  . Renal Artery stenosis   History of Present Illness: 65 yo WF with history of hyperlipidemia and renal artery stenosis with normal BP and normal renal function here today for PV follow up. She has been followed in the past by Dr. Gwenlyn Found. Most recent dopplers in August 2014 showed 50% right renal artery stenosis and no evidence of Left renal artery stenosis.  Her ABIs were normal on the right and left in December 2013. She has had a normal stress Myoview in September 2014.   She is here for follow up. She has no leg pain with walking. She has no LE edema. She does not exercise. No chest pain or SOB.   Primary Care Physician: Penni Homans, MD  Past Medical History:  Diagnosis Date  . Anxiety and depression 11/16/2012  . Breast cyst    dense  . Chicken pox as a kid  . Colon polyp   . GERD (gastroesophageal reflux disease)   . Hiatal hernia   . History of colonic polyps 01/15/2015  . History of shingles 10/04/2012  . Hyperlipidemia   . Kidney stone 10/04/2012  . Other and unspecified hyperlipidemia 10/04/2012  . Preventative health care 10/04/2012  . Renal artery stenosis (HCC)    kidney stone    Past Surgical History:  Procedure Laterality Date  . cyst removed off left side  1968   benign, skin  . POLPYPECTOMY  08/2004    Current Outpatient Prescriptions  Medication Sig Dispense Refill  . aspirin 81 MG EC tablet Take 81 mg by mouth daily.      Marland Kitchen atorvastatin (LIPITOR) 10 MG tablet TAKE 1 TABLET (10 MG TOTAL) BY MOUTH DAILY. 30 tablet 3  . calcium carbonate (TUMS - DOSED IN MG ELEMENTAL CALCIUM) 500 MG chewable tablet Chew 1 tablet by mouth daily.    . Cholecalciferol (VITAMIN D3) 5000 UNITS CAPS Take 1 capsule by mouth daily.    Astrid Drafts CAPS Take 1 capsule by mouth daily.     . Multiple Vitamins-Minerals (MULTIVITAMIN WITH MINERALS) tablet Take 1 tablet by mouth daily.      . Probiotic Product (PROBIOTIC DAILY PO) Take 1 capsule by  mouth daily.      No current facility-administered medications for this visit.     No Known Allergies  Social History   Social History  . Marital status: Single    Spouse name: N/A  . Number of children: N/A  . Years of education: N/A   Occupational History  . Not on file.   Social History Main Topics  . Smoking status: Former Smoker    Packs/day: 0.50    Years: 10.00    Types: Cigarettes    Start date: 04/18/1988  . Smokeless tobacco: Never Used  . Alcohol use Yes     Comment: occasionally  . Drug use: No  . Sexual activity: Yes    Partners: Male   Other Topics Concern  . Not on file   Social History Narrative  . No narrative on file    Family History  Problem Relation Age of Onset  . Arthritis Mother   . Heart disease Mother   . Cancer Mother 44    breast, sarcoma x 3 bouts in past, under righ arm  . Hyperlipidemia Mother   . Ulcers Father   . Hypertension Father   . Arthritis Father   . Heart disease Father     CHF  . Macular  degeneration Father   . Heart disease Maternal Grandmother   . Heart disease Paternal Grandmother   . Heart disease Paternal Grandfather   . Ulcers Paternal Grandfather   . Cancer Brother     pancreatic  . Heart disease Maternal Grandfather   . Heart attack Maternal Grandfather   . Pneumonia Sister   . Arthritis Sister     rheumatoid    Review of Systems:  As stated in the HPI and otherwise negative.   BP 118/76   Pulse (!) 59   Ht 5\' 7"  (1.702 m)   Wt 186 lb 2 oz (84.4 kg)   LMP 04/19/2007   BMI 29.15 kg/m   Physical Examination: General: Well developed, well nourished, NAD  HEENT: OP clear, mucus membranes moist  SKIN: warm, dry. No rashes. Neuro: No focal deficits  Musculoskeletal: Muscle strength 5/5 all ext  Psychiatric: Mood and affect normal  Neck: No JVD, no carotid bruits, no thyromegaly, no lymphadenopathy.  Lungs:Clear bilaterally, no wheezes, rhonci, crackles Cardiovascular: Regular rate and rhythm.  No murmurs, gallops or rubs. Abdomen:Soft. Bowel sounds present. Non-tender.  Extremities: No LE edema. Pulses are 2 + in the bilateral DP/P  EKG:  EKG is ordered today. The ekg ordered today demonstrates Sinus brady, rate 59 bpm.   Recent Labs: No results found for requested labs within last 8760 hours.   Lipid Panel    Component Value Date/Time   CHOL 160 01/15/2015 0916   TRIG 53.0 01/15/2015 0916   HDL 67.20 01/15/2015 0916   CHOLHDL 2 01/15/2015 0916   VLDL 10.6 01/15/2015 0916   LDLCALC 83 01/15/2015 0916     Wt Readings from Last 3 Encounters:  03/21/16 186 lb 2 oz (84.4 kg)  02/09/15 185 lb 3.2 oz (84 kg)  01/15/15 184 lb (83.5 kg)     Other studies Reviewed: Additional studies/ records that were reviewed today include: . Review of the above records demonstrates:    Assessment and Plan:   1. PAD: Right renal artery stenosis stable by doppler November 2016.  Repeat now. If stable, repeat in 2 years.   2. Lower extremity edema: No evidence of DVT by venous dopplers November 2016. Swelling resolved.   3. HLD: on statin.   Current medicines are reviewed at length with the patient today.  The patient does not have concerns regarding medicines.  The following changes have been made:  no change  Labs/ tests ordered today include:   No orders of the defined types were placed in this encounter.   Disposition:   FU with me in 2 years.     Signed, Lauree Chandler, MD 03/21/2016 1:34 PM    [facility] [place], [place], [place]  [address] Phone: [phone]; Fax: [phone]

## 2016-03-25 MED FILL — ATORVASTATIN 10 MG TABLET: 10 | 30 days supply | Qty: 30 | Fill #3

## 2016-03-28 ENCOUNTER — Ambulatory Visit (HOSPITAL_COMMUNITY)
Admission: RE | Admit: 2016-03-28 | Discharge: 2016-03-28 | Disposition: A | Discharge status: Home / Self Care | Payer: PRIVATE HEALTH INSURANCE | Source: Ambulatory Visit | Attending: Cardiology | Admitting: Cardiology

## 2016-03-28 DIAGNOSIS — I10 Essential (primary) hypertension: Secondary | ICD-10-CM | POA: Diagnosis not present

## 2016-03-30 ENCOUNTER — Encounter (HOSPITAL_COMMUNITY): Payer: PRIVATE HEALTH INSURANCE

## 2016-05-30 ENCOUNTER — Other Ambulatory Visit: Payer: Self-pay | Admitting: Family Medicine

## 2016-06-17 ENCOUNTER — Ambulatory Visit: Payer: PRIVATE HEALTH INSURANCE | Admitting: Cardiovascular Disease

## 2016-06-20 ENCOUNTER — Ambulatory Visit: Payer: PRIVATE HEALTH INSURANCE | Admitting: Family Medicine

## 2016-06-20 ENCOUNTER — Ambulatory Visit (INDEPENDENT_AMBULATORY_CARE_PROVIDER_SITE_OTHER): Payer: PRIVATE HEALTH INSURANCE | Admitting: Family Medicine

## 2016-06-20 ENCOUNTER — Encounter: Payer: Self-pay | Admitting: Family Medicine

## 2016-06-20 VITALS — BP 124/60 | HR 57 | Temp 98.0°F | Ht 67.0 in | Wt 186.4 lb

## 2016-06-20 DIAGNOSIS — E782 Mixed hyperlipidemia: Secondary | ICD-10-CM

## 2016-06-20 DIAGNOSIS — K219 Gastro-esophageal reflux disease without esophagitis: Secondary | ICD-10-CM | POA: Diagnosis not present

## 2016-06-20 MED ORDER — ATORVASTATIN CALCIUM 10 MG PO TABS: ORAL_TABLET | ORAL | 3 refills | Status: DC

## 2016-06-20 MED FILL — ATORVASTATIN 10 MG TABLET: 10 | 30 days supply | Qty: 30 | Fill #0

## 2016-06-20 NOTE — Progress Notes (Signed)
Patient ID: Karen Pierce, female   DOB: 11-Mar-1951, 66 y.o.   MRN: [id number]   Subjective:  I acted as a Education administrator for Penni Homans, [place], [place]   Patient ID: Karen Pierce, female    DOB: January 24, 1951, 66 y.o.   MRN: [id number]  Chief Complaint  Patient presents with  . Follow-up    Medication.  . Gastroesophageal Reflux  . Hyperlipidemia    Gastroesophageal Reflux  She complains of heartburn. She reports no chest pain, no coughing or no nausea. This is a chronic problem.  Hyperlipidemia  This is a chronic problem. The problem is controlled. Pertinent negatives include no chest pain or shortness of breath.    Patient is in today for a medication follow up. Patient has a Hx of hyperlipidemia and GERD. Patient has no acute concerns noted at this time. She denies any recent febrile illness or recent hospitalixations. Denies CP/palp/SOB/HA/congestion/fevers or GU c/o. Taking meds as prescribed  Patient Care Team: [name], MD as PCP - General (Family Medicine) Burnell Blanks, MD as Consulting Physician (Cardiology)   Past Medical History:  Diagnosis Date  . Anxiety and depression 11/16/2012  . Breast cyst    dense  . Chicken pox as a kid  . Colon polyp   . GERD (gastroesophageal reflux disease)   . Hiatal hernia   . History of colonic polyps 01/15/2015  . History of shingles 10/04/2012  . Hyperlipidemia   . Kidney stone 10/04/2012  . Other and unspecified hyperlipidemia 10/04/2012  . Preventative health care 10/04/2012  . Renal artery stenosis (HCC)    kidney stone    Past Surgical History:  Procedure Laterality Date  . cyst removed off left side  1968   benign, skin  . POLPYPECTOMY  08/2004    Family History  Problem Relation Age of Onset  . Arthritis Mother   . Heart disease Mother   . Cancer Mother 63    breast, sarcoma x 3 bouts in past, under righ arm  . Hyperlipidemia Mother   . Ulcers Father   . Hypertension Father   . Arthritis Father   .  Heart disease Father     CHF  . Macular degeneration Father   . Heart disease Maternal Grandmother   . Heart disease Paternal Grandmother   . Heart disease Paternal Grandfather   . Ulcers Paternal Grandfather   . Cancer Brother     pancreatic  . Heart disease Maternal Grandfather   . Heart attack Maternal Grandfather   . Pneumonia Sister   . Arthritis Sister     rheumatoid    Social History   Social History  . Marital status: Single    Spouse name: N/A  . Number of children: N/A  . Years of education: N/A   Occupational History  . Not on file.   Social History Main Topics  . Smoking status: Former Smoker    Packs/day: 0.50    Years: 10.00    Types: Cigarettes    Start date: 04/18/1988  . Smokeless tobacco: Never Used  . Alcohol use Yes     Comment: occasionally  . Drug use: No  . Sexual activity: Yes    Partners: Male   Other Topics Concern  . Not on file   Social History Narrative  . No narrative on file    Outpatient Medications Prior to Visit  Medication Sig Dispense Refill  . aspirin 81 MG EC tablet Take 81 mg by mouth  daily.      . calcium carbonate (TUMS - DOSED IN MG ELEMENTAL CALCIUM) 500 MG chewable tablet Chew 1 tablet by mouth daily.    . Cholecalciferol (VITAMIN D3) 5000 UNITS CAPS Take 1 capsule by mouth daily.    Astrid Drafts CAPS Take 1 capsule by mouth daily.     . Multiple Vitamins-Minerals (MULTIVITAMIN WITH MINERALS) tablet Take 1 tablet by mouth daily.      . Probiotic Product (PROBIOTIC DAILY PO) Take 1 capsule by mouth daily.     Marland Kitchen atorvastatin (LIPITOR) 10 MG tablet TAKE 1 TABLET (10 MG TOTAL) BY MOUTH DAILY. 30 tablet 3   No facility-administered medications prior to visit.     No Known Allergies  Review of Systems  Constitutional: Negative for fever and malaise/fatigue.  HENT: Negative for congestion.   Eyes: Negative for blurred vision.  Respiratory: Negative for cough and shortness of breath.   Cardiovascular: Negative for  chest pain, palpitations and leg swelling.  Gastrointestinal: Positive for heartburn. Negative for constipation, nausea and vomiting.  Musculoskeletal: Negative for back pain.  Skin: Negative for rash.  Neurological: Negative for loss of consciousness and headaches.       Objective:    Physical Exam  Constitutional: She is oriented to person, place, and time. She appears well-developed and well-nourished. No distress.  HENT:  Head: Normocephalic and atraumatic.  Eyes: Conjunctivae are normal.  Neck: Normal range of motion. No thyromegaly present.  Cardiovascular: Normal rate and regular rhythm.   Pulmonary/Chest: Effort normal and breath sounds normal. She has no wheezes.  Abdominal: Soft. Bowel sounds are normal. There is no tenderness.  Musculoskeletal: She exhibits no edema or deformity.  Neurological: She is alert and oriented to person, place, and time.  Skin: Skin is warm and dry. She is not diaphoretic.  Psychiatric: She has a normal mood and affect.    BP 124/60 (BP Location: Left Arm, Patient Position: Sitting, Cuff Size: Large)   Pulse (!) 57   Temp 98 F (36.7 C) (Oral)   Ht 5\' 7"  (1.702 m)   Wt 186 lb 6.4 oz (84.6 kg)   LMP 04/19/2007   SpO2 97% Comment: RA  BMI 29.19 kg/m  Wt Readings from Last 3 Encounters:  06/20/16 186 lb 6.4 oz (84.6 kg)  03/21/16 186 lb 2 oz (84.4 kg)  02/09/15 185 lb 3.2 oz (84 kg)     Lab Results  Component Value Date   WBC 6.4 01/15/2015   HGB 14.4 01/15/2015   HCT 43.4 01/15/2015   PLT 323.0 01/15/2015   GLUCOSE 94 01/12/2015   CHOL 160 01/15/2015   TRIG 53.0 01/15/2015   HDL 67.20 01/15/2015   LDLCALC 83 01/15/2015   ALT 20 01/12/2015   AST 20 01/12/2015   NA 143 01/12/2015   K 4.8 01/12/2015   CL 107 01/12/2015   CREATININE 0.74 01/12/2015   BUN 29 (H) 01/12/2015   CO2 29 01/12/2015   TSH 1.23 01/15/2015   HGBA1C 5.3 11/07/2012    Lab Results  Component Value Date   TSH 1.23 01/15/2015   Lab Results    Component Value Date   WBC 6.4 01/15/2015   HGB 14.4 01/15/2015   HCT 43.4 01/15/2015   MCV 90.9 01/15/2015   PLT 323.0 01/15/2015   Lab Results  Component Value Date   NA 143 01/12/2015   K 4.8 01/12/2015   CO2 29 01/12/2015   GLUCOSE 94 01/12/2015   BUN 29 (H) 01/12/2015  CREATININE 0.74 01/12/2015   BILITOT 0.5 01/12/2015   ALKPHOS 76 01/12/2015   AST 20 01/12/2015   ALT 20 01/12/2015   PROT 6.3 01/12/2015   ALBUMIN 4.1 01/12/2015   CALCIUM 9.4 01/12/2015   GFR 84.50 01/12/2015   Lab Results  Component Value Date   CHOL 160 01/15/2015   Lab Results  Component Value Date   HDL 67.20 01/15/2015   Lab Results  Component Value Date   LDLCALC 83 01/15/2015   Lab Results  Component Value Date   TRIG 53.0 01/15/2015   Lab Results  Component Value Date   CHOLHDL 2 01/15/2015   Lab Results  Component Value Date   HGBA1C 5.3 11/07/2012       Assessment & Plan:   Problem List Items Addressed This Visit    GERD (gastroesophageal reflux disease)    Throat erythematous, likely contributing to cough will start Ranitidine. Avoid offending foods, start probiotics. Do not eat large meals in late evening and consider raising head of bed.       Hyperlipidemia, mixed - Primary    Tolerating statin, encouraged heart healthy diet, avoid trans fats, minimize simple carbs and saturated fats. Increase exercise as tolerated      Relevant Medications   atorvastatin (LIPITOR) 10 MG tablet   Other Relevant Orders   Lipid panel      I am having Ms. Ferrando maintain her aspirin, multivitamin with minerals, calcium carbonate, Vitamin D3, Krill Oil, Probiotic Product (PROBIOTIC DAILY PO), and atorvastatin.  Meds ordered this encounter  Medications  . atorvastatin (LIPITOR) 10 MG tablet    Sig: TAKE 1 TABLET (10 MG TOTAL) BY MOUTH DAILY.    Dispense:  30 tablet    Refill:  3    CMA served as scribe during this visit. History, Physical and Plan performed by medical  provider. Documentation and orders reviewed and attested to.  Penni Homans, MD

## 2016-06-20 NOTE — Progress Notes (Signed)
Pre visit review using our clinic review tool, if applicable. No additional management support is needed unless otherwise documented below in the visit note. 

## 2016-06-20 NOTE — Patient Instructions (Signed)
NOW probiotic, 10 strain cap 1 cap daily can get at [employer] or online at [email or web address] or [employer] Cholesterol Cholesterol is a white, waxy, fat-like substance that is needed by the human body in small amounts. The liver makes all the cholesterol we need. Cholesterol is carried from the liver by the blood through the blood vessels. Deposits of cholesterol (plaques) may build up on blood vessel (artery) walls. Plaques make the arteries narrower and stiffer. Cholesterol plaques increase the risk for heart attack and stroke. You cannot feel your cholesterol level even if it is very high. The only way to know that it is high is to have a blood test. Once you know your cholesterol levels, you should keep a record of the test results. Work with your health care provider to keep your levels in the desired range. What do the results mean?  Total cholesterol is a rough measure of all the cholesterol in your blood.  LDL (low-density lipoprotein) is the "bad" cholesterol. This is the type that causes plaque to build up on the artery walls. You want this level to be low.  HDL (high-density lipoprotein) is the "good" cholesterol because it cleans the arteries and carries the LDL away. You want this level to be high.  Triglycerides are fat that the body can either burn for energy or store. High levels are closely linked to heart disease. What are the desired levels of cholesterol?  Total cholesterol below 200.  LDL below 100 for people who are at risk, below 70 for people at very high risk.  HDL above 40 is good. A level of 60 or higher is considered to be protective against heart disease.  Triglycerides below 150. How can I lower my cholesterol? Diet  Follow your diet program as told by your health care provider.  Choose fish or white meat chicken and [place], roasted or baked. Limit fatty cuts of red meat, fried foods, and processed meats, such as sausage and lunch meats.  Eat  lots of fresh fruits and vegetables.  Choose whole grains, beans, pasta, potatoes, and cereals.  Choose olive oil, corn oil, or canola oil, and use only small amounts.  Avoid butter, mayonnaise, shortening, or palm kernel oils.  Avoid foods with trans fats.  Drink skim or nonfat milk and eat low-fat or nonfat yogurt and cheeses. Avoid whole milk, cream, ice cream, egg yolks, and full-fat cheeses.  Healthier desserts include angel food cake, ginger snaps, animal crackers, hard candy, popsicles, and low-fat or nonfat frozen yogurt. Avoid pastries, cakes, pies, and cookies. Exercise  Follow your exercise program as told by your health care provider. A regular program:  Helps to decrease LDL and raise HDL.  Helps with weight control.  Do things that increase your activity level, such as gardening, walking, and taking the stairs.  Ask your health care provider about ways that you can be more active in your daily life. Medicine  Take over-the-counter and prescription medicines only as told by your health care provider.  Medicine may be prescribed by your health care provider to help lower cholesterol and decrease the risk for heart disease. This is usually done if diet and exercise have failed to bring down cholesterol levels.  If you have several risk factors, you may need medicine even if your levels are normal. This information is not intended to replace advice given to you by your health care provider. Make sure you discuss any questions you have with your health care provider.  Document Released: 12/28/2000 Document Revised: 10/31/2015 Document Reviewed: 10/03/2015 Elsevier Interactive Patient Education  2017 [employer].

## 2016-06-21 NOTE — Assessment & Plan Note (Signed)
Throat erythematous, likely contributing to cough will start Ranitidine. Avoid offending foods, start probiotics. Do not eat large meals in late evening and consider raising head of bed.

## 2016-06-21 NOTE — Assessment & Plan Note (Signed)
Tolerating statin, encouraged heart healthy diet, avoid trans fats, minimize simple carbs and saturated fats. Increase exercise as tolerated 

## 2016-07-04 ENCOUNTER — Other Ambulatory Visit (INDEPENDENT_AMBULATORY_CARE_PROVIDER_SITE_OTHER): Payer: PRIVATE HEALTH INSURANCE

## 2016-07-04 DIAGNOSIS — E782 Mixed hyperlipidemia: Secondary | ICD-10-CM | POA: Diagnosis not present

## 2016-07-04 LAB — LIPID PANEL
CHOLESTEROL: 175 mg/dL (ref 0–200)
HDL: 70 mg/dL (ref >39.00)
LDL Cholesterol: 94 mg/dL (ref 0–99)
NonHDL: 104.79
TRIGLYCERIDES: 55 mg/dL (ref 0.0–149.0)
Total CHOL/HDL Ratio: 2
VLDL: 11 mg/dL (ref 0.0–40.0)

## 2016-08-22 MED FILL — ATORVASTATIN 10 MG TABLET: 10 | 30 days supply | Qty: 30 | Fill #1

## 2016-10-07 ENCOUNTER — Encounter: Payer: PRIVATE HEALTH INSURANCE | Admitting: Family Medicine

## 2016-11-10 MED FILL — ATORVASTATIN 10 MG TABLET: 10 | 30 days supply | Qty: 30 | Fill #2

## 2016-11-29 ENCOUNTER — Other Ambulatory Visit (HOSPITAL_COMMUNITY)
Admission: RE | Admit: 2016-11-29 | Discharge: 2016-11-29 | Disposition: A | Discharge status: Home / Self Care | Payer: PRIVATE HEALTH INSURANCE | Source: Ambulatory Visit | Attending: Family Medicine | Admitting: Family Medicine

## 2016-11-29 ENCOUNTER — Ambulatory Visit (INDEPENDENT_AMBULATORY_CARE_PROVIDER_SITE_OTHER): Payer: PRIVATE HEALTH INSURANCE | Admitting: Family Medicine

## 2016-11-29 ENCOUNTER — Encounter: Payer: Self-pay | Admitting: Family Medicine

## 2016-11-29 VITALS — BP 116/78 | HR 60 | Temp 98.1°F | Ht 67.0 in | Wt 167.8 lb

## 2016-11-29 DIAGNOSIS — E663 Overweight: Secondary | ICD-10-CM | POA: Diagnosis not present

## 2016-11-29 DIAGNOSIS — Z1231 Encounter for screening mammogram for malignant neoplasm of breast: Secondary | ICD-10-CM

## 2016-11-29 DIAGNOSIS — E785 Hyperlipidemia, unspecified: Secondary | ICD-10-CM | POA: Diagnosis not present

## 2016-11-29 DIAGNOSIS — Z0000 Encounter for general adult medical examination without abnormal findings: Secondary | ICD-10-CM

## 2016-11-29 DIAGNOSIS — R05 Cough: Secondary | ICD-10-CM | POA: Diagnosis not present

## 2016-11-29 DIAGNOSIS — Z124 Encounter for screening for malignant neoplasm of cervix: Secondary | ICD-10-CM | POA: Diagnosis not present

## 2016-11-29 DIAGNOSIS — Z01419 Encounter for gynecological examination (general) (routine) without abnormal findings: Secondary | ICD-10-CM | POA: Insufficient documentation

## 2016-11-29 DIAGNOSIS — K219 Gastro-esophageal reflux disease without esophagitis: Secondary | ICD-10-CM | POA: Diagnosis not present

## 2016-11-29 DIAGNOSIS — R059 Cough, unspecified: Secondary | ICD-10-CM | POA: Insufficient documentation

## 2016-11-29 DIAGNOSIS — Z1239 Encounter for other screening for malignant neoplasm of breast: Secondary | ICD-10-CM

## 2016-11-29 HISTORY — DX: Overweight: E66.3

## 2016-11-29 LAB — CBC
HCT: 43.8 % (ref 36.0–46.0)
Hemoglobin: 14.5 g/dL (ref 12.0–15.0)
MCHC: 33.1 g/dL (ref 30.0–36.0)
MCV: 94 fl (ref 78.0–100.0)
Platelets: 286 10*3/uL (ref 150.0–400.0)
RBC: 4.66 Mil/uL (ref 3.87–5.11)
RDW: 13.1 % (ref 11.5–15.5)
WBC: 5.8 10*3/uL (ref 4.0–10.5)

## 2016-11-29 LAB — COMPREHENSIVE METABOLIC PANEL
ALT: 17 U/L (ref 0–35)
AST: 19 U/L (ref 0–37)
Albumin: 4.6 g/dL (ref 3.5–5.2)
Alkaline Phosphatase: 62 U/L (ref 39–117)
BUN: 20 mg/dL (ref 6–23)
CHLORIDE: 102 meq/L (ref 96–112)
CO2: 30 mEq/L (ref 19–32)
Calcium: 9.4 mg/dL (ref 8.4–10.5)
Creatinine, Ser: 0.68 mg/dL (ref 0.40–1.20)
GFR: 92.6 mL/min (ref >60.00)
GLUCOSE: 101 mg/dL — AB (ref 70–99)
POTASSIUM: 4.4 meq/L (ref 3.5–5.1)
SODIUM: 138 meq/L (ref 135–145)
Total Bilirubin: 0.7 mg/dL (ref 0.2–1.2)
Total Protein: 6.3 g/dL (ref 6.0–8.3)

## 2016-11-29 LAB — LIPID PANEL
CHOL/HDL RATIO: 2
Cholesterol: 152 mg/dL (ref 0–200)
HDL: 70.8 mg/dL (ref >39.00)
LDL CALC: 71 mg/dL (ref 0–99)
NONHDL: 80.79
Triglycerides: 47 mg/dL (ref 0.0–149.0)
VLDL: 9.4 mg/dL (ref 0.0–40.0)

## 2016-11-29 LAB — TSH: TSH: 1.44 u[IU]/mL (ref 0.35–4.50)

## 2016-11-29 NOTE — Patient Instructions (Signed)
Shingrix new shingles shot, 2 shots over 6 month, check with insurance and confirm payment then can get here or at [place] [facility], Female Preventive care refers to lifestyle choices and visits with your health care provider that can promote health and wellness. What does preventive care include?  A yearly physical exam. This is also called an annual well check.  Dental exams once or twice a year.  Routine eye exams. Ask your health care provider how often you should have your eyes checked.  Personal lifestyle choices, including: ? Daily care of your teeth and gums. ? Regular physical activity. ? Eating a healthy diet. ? Avoiding tobacco and drug use. ? Limiting alcohol use. ? Practicing safe sex. ? Taking low-dose aspirin daily starting at age 32. ? Taking vitamin and mineral supplements as recommended by your health care provider. What happens during an annual well check? The services and screenings done by your health care provider during your annual well check will depend on your age, overall health, lifestyle risk factors, and family history of disease. Counseling Your health care provider may ask you questions about your:  Alcohol use.  Tobacco use.  Drug use.  Emotional well-being.  Home and relationship well-being.  Sexual activity.  Eating habits.  Work and work Statistician.  Method of birth control.  Menstrual cycle.  Pregnancy history.  Screening You may have the following tests or measurements:  Height, weight, and BMI.  Blood pressure.  Lipid and cholesterol levels. These may be checked every 5 years, or more frequently if you are over 73 years old.  Skin check.  Lung cancer screening. You may have this screening every year starting at age 47 if you have a 30-pack-year history of smoking and currently smoke or have quit within the past 15 years.  Fecal occult blood test (FOBT) of the stool. You may have this test every year  starting at age 66.  Flexible sigmoidoscopy or colonoscopy. You may have a sigmoidoscopy every 5 years or a colonoscopy every 10 years starting at age 19.  Hepatitis C blood test.  Hepatitis B blood test.  Sexually transmitted disease (STD) testing.  Diabetes screening. This is done by checking your blood sugar (glucose) after you have not eaten for a while (fasting). You may have this done every 1-3 years.  Mammogram. This may be done every 1-2 years. Talk to your health care provider about when you should start having regular mammograms. This may depend on whether you have a family history of breast cancer.  BRCA-related cancer screening. This may be done if you have a family history of breast, ovarian, tubal, or peritoneal cancers.  Pelvic exam and Pap test. This may be done every 3 years starting at age 39. Starting at age 18, this may be done every 5 years if you have a Pap test in combination with an HPV test.  Bone density scan. This is done to screen for osteoporosis. You may have this scan if you are at high risk for osteoporosis.  Discuss your test results, treatment options, and if necessary, the need for more tests with your health care provider. Vaccines Your health care provider may recommend certain vaccines, such as:  Influenza vaccine. This is recommended every year.  Tetanus, diphtheria, and acellular pertussis (Tdap, Td) vaccine. You may need a Td booster every 10 years.  Varicella vaccine. You may need this if you have not been vaccinated.  Zoster vaccine. You may need this after age  15.  Measles, mumps, and rubella (MMR) vaccine. You may need at least one dose of MMR if you were born in 1957 or later. You may also need a second dose.  Pneumococcal 13-valent conjugate (PCV13) vaccine. You may need this if you have certain conditions and were not previously vaccinated.  Pneumococcal polysaccharide (PPSV23) vaccine. You may need one or two doses if you smoke  cigarettes or if you have certain conditions.  Meningococcal vaccine. You may need this if you have certain conditions.  Hepatitis A vaccine. You may need this if you have certain conditions or if you travel or work in places where you may be exposed to hepatitis A.  Hepatitis B vaccine. You may need this if you have certain conditions or if you travel or work in places where you may be exposed to hepatitis B.  Haemophilus influenzae type b (Hib) vaccine. You may need this if you have certain conditions.  Talk to your health care provider about which screenings and vaccines you need and how often you need them. This information is not intended to replace advice given to you by your health care provider. Make sure you discuss any questions you have with your health care provider. Document Released: 05/01/2015 Document Revised: 12/23/2015 Document Reviewed: 02/03/2015 Elsevier Interactive Patient Education  2017 [employer].

## 2016-11-29 NOTE — Assessment & Plan Note (Signed)
Encouraged DASH diet, decrease po intake and increase exercise as tolerated. Needs 7-8 hours of sleep nightly. Avoid trans fats, eat small, frequent meals every 4-5 hours with lean proteins, complex carbs and healthy fats. Minimize simple carbs 

## 2016-11-29 NOTE — Assessment & Plan Note (Addendum)
Mild and productive of clear sputum at times. She feels it started after a cold in the winter and has persisted. May be related to dehydration will drink more and consider pepcid and zyrtec daily if symptoms persist then may need to consider referral ENT and CXR

## 2016-11-29 NOTE — Assessment & Plan Note (Signed)
Using Pepcid 4-5 x a month and it helps

## 2016-11-29 NOTE — Assessment & Plan Note (Signed)
Pap today, no concerns on exam.  

## 2016-11-29 NOTE — Progress Notes (Signed)
Subjective:  I acted as a Education administrator for Dr. Charlett Blake. [place], [place]  Patient ID: Karen Pierce, female    DOB: 02/18/1951, 66 y.o.   MRN: [id number]  Chief Complaint  Patient presents with  . Annual Exam    HPI  Patient is in today for an annual exam. Patient is following up on her hyperlipidemia and other medical concerns. No recent febrile illness or acute hospitalizations. Denies CP/palp/SOB/HA/fevers or GU c/o. Taking meds as prescribed. Mild and productive of clear sputum at times. She feels it started after a cold in the winter and has persisted. May be related to dehydration as she does not drink near 64 oz of clear fluids daily. She does acknowledge having bad enough heartburn to take a Pepcid 4-5 x a month usually when she eats something she should not eat. She has a good response to the medicine. She has been walking more since getting a new puppy and has decreased her portion size so she is pleased with her weight loss.    Patient Care Team: [name], MD as PCP - General (Family Medicine) Burnell Blanks, MD as Consulting Physician (Cardiology)   Past Medical History:  Diagnosis Date  . Anxiety and depression 11/16/2012  . Breast cyst    dense  . Chicken pox as a kid  . Colon polyp   . GERD (gastroesophageal reflux disease)   . Hiatal hernia   . History of colonic polyps 01/15/2015  . History of shingles 10/04/2012  . Hyperlipidemia   . Kidney stone 10/04/2012  . Other and unspecified hyperlipidemia 10/04/2012  . Overweight 11/29/2016  . Preventative health care 10/04/2012  . Renal artery stenosis (HCC)    kidney stone    Past Surgical History:  Procedure Laterality Date  . cyst removed off left side  1968   benign, skin  . POLPYPECTOMY  08/2004    Family History  Problem Relation Age of Onset  . Arthritis Mother   . Heart disease Mother   . Cancer Mother 73       breast, sarcoma x 3 bouts in past, under righ arm  . Hyperlipidemia Mother   . Ulcers  Father   . Hypertension Father   . Arthritis Father   . Heart disease Father        CHF  . Macular degeneration Father   . Heart disease Maternal Grandmother   . Heart disease Paternal Grandmother   . Heart disease Paternal Grandfather   . Ulcers Paternal Grandfather   . Cancer Brother        pancreatic  . Heart disease Maternal Grandfather   . Heart attack Maternal Grandfather   . Pneumonia Sister   . Arthritis Sister        rheumatoid    Social History   Social History  . Marital status: Single    Spouse name: N/A  . Number of children: N/A  . Years of education: N/A   Occupational History  . Not on file.   Social History Main Topics  . Smoking status: Former Smoker    Packs/day: 0.50    Years: 10.00    Types: Cigarettes    Start date: 04/18/1988  . Smokeless tobacco: Never Used  . Alcohol use Yes     Comment: occasionally  . Drug use: No  . Sexual activity: Yes    Partners: Male   Other Topics Concern  . Not on file   Social History Narrative  .  No narrative on file    Outpatient Medications Prior to Visit  Medication Sig Dispense Refill  . aspirin 81 MG EC tablet Take 81 mg by mouth daily.      Marland Kitchen atorvastatin (LIPITOR) 10 MG tablet TAKE 1 TABLET (10 MG TOTAL) BY MOUTH DAILY. 30 tablet 3  . calcium carbonate (TUMS - DOSED IN MG ELEMENTAL CALCIUM) 500 MG chewable tablet Chew 1 tablet by mouth daily.    . Cholecalciferol (VITAMIN D3) 5000 UNITS CAPS Take 1 capsule by mouth daily.    Astrid Drafts CAPS Take 1 capsule by mouth daily.     . Multiple Vitamins-Minerals (MULTIVITAMIN WITH MINERALS) tablet Take 1 tablet by mouth daily.      . Probiotic Product (PROBIOTIC DAILY PO) Take 1 capsule by mouth daily.      No facility-administered medications prior to visit.     No Known Allergies  Review of Systems  Constitutional: Negative for fever and malaise/fatigue.  HENT: Negative for congestion.   Eyes: Negative for blurred vision.  Respiratory: Positive  for cough, hemoptysis and sputum production. Negative for shortness of breath and wheezing.   Cardiovascular: Negative for chest pain, palpitations and leg swelling.  Gastrointestinal: Positive for heartburn. Negative for vomiting.  Musculoskeletal: Negative for back pain.  Skin: Negative for rash.  Neurological: Negative for loss of consciousness and headaches.       Objective:    Physical Exam  Constitutional: She is oriented to person, place, and time. She appears well-developed and well-nourished. No distress.  HENT:  Head: Normocephalic and atraumatic.  Eyes: Conjunctivae are normal.  Neck: Normal range of motion. No thyromegaly present.  Cardiovascular: Normal rate and regular rhythm.   Pulmonary/Chest: Effort normal and breath sounds normal. She has no wheezes.  Abdominal: Soft. Bowel sounds are normal. There is no tenderness.  Genitourinary: Vagina normal and uterus normal. No vaginal discharge found.  Genitourinary Comments: Breast exam without lesions, discharge or skin changes.   Musculoskeletal: Normal range of motion. She exhibits no edema or deformity.  Neurological: She is alert and oriented to person, place, and time.  Skin: Skin is warm and dry. She is not diaphoretic.  Psychiatric: She has a normal mood and affect.    BP 116/78 (BP Location: Left Arm, Patient Position: Sitting, Cuff Size: Normal)   Pulse 60   Temp 98.1 F (36.7 C) (Oral)   Ht 5\' 7"  (1.702 m)   Wt 167 lb 12.8 oz (76.1 kg)   LMP 04/19/2007   SpO2 98%   BMI 26.28 kg/m  Wt Readings from Last 3 Encounters:  11/29/16 167 lb 12.8 oz (76.1 kg)  06/20/16 186 lb 6.4 oz (84.6 kg)  03/21/16 186 lb 2 oz (84.4 kg)   BP Readings from Last 3 Encounters:  11/29/16 116/78  06/20/16 124/60  03/21/16 118/76     Immunization History  Administered Date(s) Administered  . Influenza Split 01/27/2012  . Influenza-Unspecified 02/02/2015  . Td 10/24/1997  . Tdap 07/21/2008  . Zoster 01/15/2015      Health Maintenance  Topic Date Due  . PAP SMEAR  11/17/2015  . INFLUENZA VACCINE  11/16/2016  . MAMMOGRAM  05/11/2017  . TETANUS/TDAP  07/22/2018  . COLONOSCOPY  08/27/2025  . Hepatitis C Screening  Completed  . HIV Screening  Completed    Lab Results  Component Value Date   WBC 6.4 01/15/2015   HGB 14.4 01/15/2015   HCT 43.4 01/15/2015   PLT 323.0 01/15/2015   GLUCOSE  94 01/12/2015   CHOL 175 07/04/2016   TRIG 55.0 07/04/2016   HDL 70.00 07/04/2016   LDLCALC 94 07/04/2016   ALT 20 01/12/2015   AST 20 01/12/2015   NA 143 01/12/2015   K 4.8 01/12/2015   CL 107 01/12/2015   CREATININE 0.74 01/12/2015   BUN 29 (H) 01/12/2015   CO2 29 01/12/2015   TSH 1.23 01/15/2015   HGBA1C 5.3 11/07/2012    Lab Results  Component Value Date   TSH 1.23 01/15/2015   Lab Results  Component Value Date   WBC 6.4 01/15/2015   HGB 14.4 01/15/2015   HCT 43.4 01/15/2015   MCV 90.9 01/15/2015   PLT 323.0 01/15/2015   Lab Results  Component Value Date   NA 143 01/12/2015   K 4.8 01/12/2015   CO2 29 01/12/2015   GLUCOSE 94 01/12/2015   BUN 29 (H) 01/12/2015   CREATININE 0.74 01/12/2015   BILITOT 0.5 01/12/2015   ALKPHOS 76 01/12/2015   AST 20 01/12/2015   ALT 20 01/12/2015   PROT 6.3 01/12/2015   ALBUMIN 4.1 01/12/2015   CALCIUM 9.4 01/12/2015   GFR 84.50 01/12/2015   Lab Results  Component Value Date   CHOL 175 07/04/2016   Lab Results  Component Value Date   HDL 70.00 07/04/2016   Lab Results  Component Value Date   LDLCALC 94 07/04/2016   Lab Results  Component Value Date   TRIG 55.0 07/04/2016   Lab Results  Component Value Date   CHOLHDL 2 07/04/2016   Lab Results  Component Value Date   HGBA1C 5.3 11/07/2012         Assessment & Plan:   Problem List Items Addressed This Visit    GERD (gastroesophageal reflux disease)    Using Pepcid 4-5 x a month and it helps      Preventative health care    Patient encouraged to maintain heart healthy diet,  regular exercise, adequate sleep. Consider daily probiotics. Take medications as prescribed. Labs ordered. Pap completed. MGM completed. Colonoscopy UTD      Relevant Orders   CBC   Comprehensive metabolic panel   TSH   Cervical cancer screening    Pap today, no concerns on exam.       Relevant Orders   Cytology - PAP   Overweight    Encouraged DASH diet, decrease po intake and increase exercise as tolerated. Needs 7-8 hours of sleep nightly. Avoid trans fats, eat small, frequent meals every 4-5 hours with lean proteins, complex carbs and healthy fats. Minimize simple carbs.      Cough    Mild and productive of clear sputum at times. She feels it started after a cold in the winter and has persisted. May be related to dehydration will drink more and consider pepcid and zyrtec daily if symptoms persist then may need to consider referral ENT and CXR       Other Visit Diagnoses    Breast cancer screening    -  Primary   Relevant Orders   MM SCREENING BREAST TOMO BILATERAL   Hyperlipidemia, unspecified hyperlipidemia type       Relevant Orders   Lipid panel      I am having Ms. Spease maintain her aspirin, multivitamin with minerals, calcium carbonate, Vitamin D3, Krill Oil, Probiotic Product (PROBIOTIC DAILY PO), and atorvastatin.  No orders of the defined types were placed in this encounter.   CMA served as Education administrator during this visit. History, Physical  and Plan performed by medical provider. Documentation and orders reviewed and attested to.  Penni Homans, MD

## 2016-11-29 NOTE — Assessment & Plan Note (Signed)
Patient encouraged to maintain heart healthy diet, regular exercise, adequate sleep. Consider daily probiotics. Take medications as prescribed. Labs ordered. Pap completed. MGM completed. Colonoscopy UTD

## 2016-12-01 LAB — CYTOLOGY - PAP: DIAGNOSIS: NEGATIVE

## 2016-12-26 MED FILL — ATORVASTATIN 10 MG TABLET: 10 | 30 days supply | Qty: 30 | Fill #3

## 2016-12-27 ENCOUNTER — Ambulatory Visit (HOSPITAL_BASED_OUTPATIENT_CLINIC_OR_DEPARTMENT_OTHER)
Admission: RE | Admit: 2016-12-27 | Discharge: 2016-12-27 | Disposition: A | Discharge status: Home / Self Care | Payer: PRIVATE HEALTH INSURANCE | Source: Ambulatory Visit | Attending: Family Medicine | Admitting: Family Medicine

## 2016-12-27 ENCOUNTER — Encounter (HOSPITAL_BASED_OUTPATIENT_CLINIC_OR_DEPARTMENT_OTHER): Payer: Self-pay

## 2016-12-27 DIAGNOSIS — Z1231 Encounter for screening mammogram for malignant neoplasm of breast: Secondary | ICD-10-CM | POA: Insufficient documentation

## 2016-12-27 DIAGNOSIS — Z1239 Encounter for other screening for malignant neoplasm of breast: Secondary | ICD-10-CM

## 2017-01-04 ENCOUNTER — Telehealth: Payer: Self-pay | Admitting: Family Medicine

## 2017-01-04 ENCOUNTER — Ambulatory Visit (INDEPENDENT_AMBULATORY_CARE_PROVIDER_SITE_OTHER): Payer: PRIVATE HEALTH INSURANCE | Admitting: Family Medicine

## 2017-01-04 ENCOUNTER — Encounter: Payer: Self-pay | Admitting: Family Medicine

## 2017-01-04 ENCOUNTER — Ambulatory Visit (HOSPITAL_BASED_OUTPATIENT_CLINIC_OR_DEPARTMENT_OTHER)
Admission: RE | Admit: 2017-01-04 | Discharge: 2017-01-04 | Disposition: A | Discharge status: Home / Self Care | Payer: PRIVATE HEALTH INSURANCE | Source: Ambulatory Visit | Attending: Family Medicine | Admitting: Family Medicine

## 2017-01-04 VITALS — BP 111/49 | HR 60 | Temp 98.7°F | Ht 67.0 in | Wt 166.2 lb

## 2017-01-04 DIAGNOSIS — N132 Hydronephrosis with renal and ureteral calculous obstruction: Secondary | ICD-10-CM | POA: Diagnosis not present

## 2017-01-04 DIAGNOSIS — N200 Calculus of kidney: Secondary | ICD-10-CM | POA: Diagnosis not present

## 2017-01-04 DIAGNOSIS — N23 Unspecified renal colic: Secondary | ICD-10-CM | POA: Diagnosis not present

## 2017-01-04 DIAGNOSIS — R1031 Right lower quadrant pain: Secondary | ICD-10-CM

## 2017-01-04 DIAGNOSIS — D252 Subserosal leiomyoma of uterus: Secondary | ICD-10-CM | POA: Diagnosis not present

## 2017-01-04 DIAGNOSIS — K5730 Diverticulosis of large intestine without perforation or abscess without bleeding: Secondary | ICD-10-CM | POA: Insufficient documentation

## 2017-01-04 LAB — URINALYSIS
BILIRUBIN URINE: NEGATIVE
Ketones, ur: 15 — AB
LEUKOCYTES UA: NEGATIVE
Nitrite: NEGATIVE
PH: 5.5 (ref 5.0–8.0)
Specific Gravity, Urine: 1.025 (ref 1.000–1.030)
Total Protein, Urine: NEGATIVE
Urine Glucose: NEGATIVE
Urobilinogen, UA: 0.2 (ref 0.0–1.0)

## 2017-01-04 MED ORDER — TAMSULOSIN HCL 0.4 MG PO CAPS: 0.4000 mg | ORAL_CAPSULE | Freq: Every day | ORAL | 0 refills | Status: DC

## 2017-01-04 MED ORDER — NAPROXEN 500 MG PO TABS: 500.0000 mg | ORAL_TABLET | Freq: Two times a day (BID) | ORAL | 0 refills | Status: DC

## 2017-01-04 MED FILL — NAPROXEN 500 MG TABLET: 500 | 15 days supply | Qty: 30 | Fill #0

## 2017-01-04 MED FILL — TAMSULOSIN HCL 0.4 MG CAP: 0.4 | 30 days supply | Qty: 30 | Fill #0

## 2017-01-04 NOTE — Telephone Encounter (Signed)
Pt called in to check the status of her lab results.  Pt would like to have Rx sent to [facility]

## 2017-01-04 NOTE — Assessment & Plan Note (Signed)
Symptoms seem consistent with stone etiology. She has a history of nephrolithiasis but has never persisted this long.  - Urinalysis - CT stone study. We'll call with results and depending on if stone is present and size of stone will determine medication and ongoing management.

## 2017-01-04 NOTE — Patient Instructions (Signed)
Thank you for coming in,   Please go downstairs for the CT scan and we will call you with the results.    Please feel free to call with any questions or concerns at any time, at [phone]. --Dr. Raeford Razor

## 2017-01-04 NOTE — Telephone Encounter (Signed)
Patient showed up in the lobby after picking up her Naproxen and Flomax/I went over instructions with her/gave her a urine strainer and advised to increase hydration and if she catches a stone we need to be able to get it sent to a lab for analysis/also advised that she call me tomorrow to advise how she is doing/thx dmf

## 2017-01-04 NOTE — Telephone Encounter (Signed)
Left VM for patient. If she calls back please have her speak with a nurse/CMA and inform her that she has stones in both kidneys with the largest being 14mm. These should pass with her increasing her water intake and with the medication. Please advise her to pick up a strainer so she can collect the stones if they pass. I have sent in naproxen for the pain. Please have her inform me if she needs more for breakthrough.   If any questions then please take the best time and phone number to call and I will try to call her back.   Rosemarie Ax, MD [facility] 01/04/2017, 3:04 PM

## 2017-01-04 NOTE — Progress Notes (Signed)
Karen Pierce - 66 y.o. female MRN [id number]  Date of birth: 09-Feb-1951  SUBJECTIVE:  Including CC & ROS.  Chief Complaint  Patient presents with  . Flank Pain    Patient is here today C/O right flank pain that started on 9.16.18 at 2am.  She describes as waves of pain that are like the pain she had with past kidney stones. Pain also causes her to be nauseated.    Karen Pierce is a 66 year old female is presenting with right flank pain. This pain started a couple of days ago. She reports having a history of kidney stones. Her symptoms started early Monday morning and have been intermittent time. She is also having pain that radiates down to her groin. She denies any gross hematuria. She has taken some old medications for pain and nausea. Is also having some vomiting. She denies any fevers. She still feels sore in her flank region. She denies any trauma.   Review of her ultrasound of her kidneys from 12/16/14 shows suspected right pole nephrolithiasis and no hydronephrosis. Review of her blood work from 11/29/16 shows normal kidney function.  Review of Systems  Gastrointestinal: Positive for nausea and vomiting.  Genitourinary: Negative for hematuria.  Musculoskeletal: Positive for back pain.  Skin: Negative for color change.    HISTORY: Past Medical, Surgical, Social, and Family History Reviewed & Updated per EMR.   Pertinent Historical Findings include:  Past Medical History:  Diagnosis Date  . Anxiety and depression 11/16/2012  . Breast cyst    dense  . Chicken pox as a kid  . Colon polyp   . GERD (gastroesophageal reflux disease)   . Hiatal hernia   . History of colonic polyps 01/15/2015  . History of shingles 10/04/2012  . Hyperlipidemia   . Kidney stone 10/04/2012  . Other and unspecified hyperlipidemia 10/04/2012  . Overweight 11/29/2016  . Preventative health care 10/04/2012  . Renal artery stenosis (HCC)    kidney stone    Past Surgical History:  Procedure Laterality Date  .  cyst removed off left side  1968   benign, skin  . POLPYPECTOMY  08/2004    No Known Allergies  Family History  Problem Relation Age of Onset  . Arthritis Mother   . Heart disease Mother   . Cancer Mother 59       breast, sarcoma x 3 bouts in past, under righ arm  . Hyperlipidemia Mother   . Ulcers Father   . Hypertension Father   . Arthritis Father   . Heart disease Father        CHF  . Macular degeneration Father   . Heart disease Maternal Grandmother   . Heart disease Paternal Grandmother   . Heart disease Paternal Grandfather   . Ulcers Paternal Grandfather   . Cancer Brother        pancreatic  . Heart disease Maternal Grandfather   . Heart attack Maternal Grandfather   . Pneumonia Sister   . Arthritis Sister        rheumatoid     Social History   Social History  . Marital status: Single    Spouse name: N/A  . Number of children: N/A  . Years of education: N/A   Occupational History  . Not on file.   Social History Main Topics  . Smoking status: Former Smoker    Packs/day: 0.50    Years: 10.00    Types: Cigarettes    Start date: 04/18/1988  .  Smokeless tobacco: Never Used  . Alcohol use Yes     Comment: occasionally  . Drug use: No  . Sexual activity: Yes    Partners: Male   Other Topics Concern  . Not on file   Social History Narrative  . No narrative on file     PHYSICAL EXAM:  VS: BP (!) 111/49 (BP Location: Left Arm, Patient Position: Sitting, Cuff Size: Normal)   Pulse 60   Temp 98.7 F (37.1 C) (Oral)   Ht 5\' 7"  (1.702 m)   Wt 166 lb 3.2 oz (75.4 kg)   LMP 04/19/2007   SpO2 100%   BMI 26.03 kg/m  Physical Exam Gen: NAD, alert, cooperative with exam,  ENT: normal lips, normal nasal mucosa,  Eye: normal EOM, normal conjunctiva and lids CV:  no edema, +2 pedal pulses   Resp: no accessory muscle use, non-labored,  GI: no masses or tenderness, no hernia, no distention, positive bowel sounds  Skin: no rashes, no areas of induration   Neuro: normal tone, normal sensation to touch Psych:  normal insight, alert and oriented MSK: Normal gait, normal strength, some CVA tenderness on the right      ASSESSMENT & PLAN:   Renal colic on right side Symptoms seem consistent with stone etiology. She has a history of nephrolithiasis but has never persisted this long.  - Urinalysis - CT stone study. We'll call with results and depending on if stone is present and size of stone will determine medication and ongoing management.

## 2017-01-05 ENCOUNTER — Other Ambulatory Visit (INDEPENDENT_AMBULATORY_CARE_PROVIDER_SITE_OTHER): Payer: PRIVATE HEALTH INSURANCE

## 2017-01-05 ENCOUNTER — Other Ambulatory Visit: Payer: Self-pay | Admitting: Family Medicine

## 2017-01-05 DIAGNOSIS — N200 Calculus of kidney: Secondary | ICD-10-CM | POA: Diagnosis not present

## 2017-01-05 LAB — BASIC METABOLIC PANEL
BUN: 25 mg/dL — ABNORMAL HIGH (ref 6–23)
CHLORIDE: 104 meq/L (ref 96–112)
CO2: 27 meq/L (ref 19–32)
Calcium: 9.5 mg/dL (ref 8.4–10.5)
Creatinine, Ser: 1.26 mg/dL — ABNORMAL HIGH (ref 0.40–1.20)
GFR: 45.43 mL/min — ABNORMAL LOW (ref >60.00)
Glucose, Bld: 92 mg/dL (ref 70–99)
Potassium: 4.3 mEq/L (ref 3.5–5.1)
SODIUM: 139 meq/L (ref 135–145)

## 2017-01-05 NOTE — Addendum Note (Signed)
Addended by: Harl Bowie on: 01/05/2017 03:55 PM   Modules accepted: Orders

## 2017-01-05 NOTE — Addendum Note (Signed)
Addended by: Harl Bowie on: 01/05/2017 03:52 PM   Modules accepted: Orders

## 2017-01-05 NOTE — Progress Notes (Signed)
Spoke with patient. Still having pain. BMP and urology referral placed.   Rosemarie Ax, MD [facility] 01/05/2017, 9:08 AM

## 2017-01-06 ENCOUNTER — Telehealth: Payer: Self-pay | Admitting: Family Medicine

## 2017-01-06 MED ORDER — TRAMADOL HCL 50 MG PO TABS
50.0000 mg | ORAL_TABLET | Freq: Three times a day (TID) | ORAL | 0 refills | Status: DC | PRN
Start: 2017-01-06 — End: 2017-12-04

## 2017-01-06 NOTE — Telephone Encounter (Signed)
Sent message to JS/thx dmf

## 2017-01-06 NOTE — Telephone Encounter (Signed)
Pt called in requesting a call back. She had a visit with Dr. Rosiland Oz and need to be advised further. She said that the medication that was prescribed isn't good for kidney stones. She would like some pain medication if possible.    Please advise.

## 2017-01-06 NOTE — Telephone Encounter (Signed)
JS-Patient is calling stating that she needs help with her pain/the current medication is not helping/plz advise/thx dmf

## 2017-01-06 NOTE — Telephone Encounter (Signed)
Spoke with patient. Still having pain. Will stop the Flomax and naproxen. We'll send and tramadol. Advised to seek immediate care if the pain is unbearable this weekend in the emergency room. She has appointment on Monday at 11 AM with the urologist.  Rosemarie Ax, MD [facility] 01/06/2017, 3:41 PM

## 2017-01-06 NOTE — Telephone Encounter (Signed)
Relation to ZV:GJFT Call back number:[phone]   Reason for call:  Patient inquiring about lab results, please advise

## 2017-01-06 NOTE — Addendum Note (Signed)
Addended by: Magdalene Molly A on: 01/06/2017 10:18 AM   Modules accepted: Orders

## 2017-01-13 ENCOUNTER — Other Ambulatory Visit (INDEPENDENT_AMBULATORY_CARE_PROVIDER_SITE_OTHER): Payer: PRIVATE HEALTH INSURANCE

## 2017-01-13 DIAGNOSIS — N200 Calculus of kidney: Secondary | ICD-10-CM | POA: Diagnosis not present

## 2017-01-13 LAB — COMPREHENSIVE METABOLIC PANEL
ALBUMIN: 4 g/dL (ref 3.5–5.2)
ALK PHOS: 64 U/L (ref 39–117)
ALT: 13 U/L (ref 0–35)
AST: 14 U/L (ref 0–37)
BUN: 26 mg/dL — ABNORMAL HIGH (ref 6–23)
CALCIUM: 9.6 mg/dL (ref 8.4–10.5)
CO2: 30 mEq/L (ref 19–32)
Chloride: 104 mEq/L (ref 96–112)
Creatinine, Ser: 0.79 mg/dL (ref 0.40–1.20)
GFR: 77.85 mL/min (ref >60.00)
GLUCOSE: 89 mg/dL (ref 70–99)
POTASSIUM: 4.7 meq/L (ref 3.5–5.1)
Sodium: 140 mEq/L (ref 135–145)
TOTAL PROTEIN: 6.3 g/dL (ref 6.0–8.3)
Total Bilirubin: 0.6 mg/dL (ref 0.2–1.2)

## 2017-04-03 ENCOUNTER — Encounter: Payer: Self-pay | Admitting: Family Medicine

## 2017-04-03 ENCOUNTER — Ambulatory Visit: Payer: PRIVATE HEALTH INSURANCE | Admitting: Family Medicine

## 2017-04-03 VITALS — BP 120/72 | HR 67 | Temp 99.3°F | Ht 67.0 in | Wt 158.2 lb

## 2017-04-03 DIAGNOSIS — J209 Acute bronchitis, unspecified: Secondary | ICD-10-CM

## 2017-04-03 MED ORDER — BENZONATATE 100 MG PO CAPS: 100.0000 mg | ORAL_CAPSULE | Freq: Three times a day (TID) | ORAL | 0 refills | Status: DC | PRN

## 2017-04-03 MED ORDER — HYDROCOD POLST-CPM POLST ER 10-8 MG/5ML PO SUER
5.0000 mL | Freq: Every evening | ORAL | 0 refills | Status: DC | PRN
Start: 2017-04-03 — End: 2017-12-04

## 2017-04-03 MED FILL — HYDROCODONE-CHLORPHENIRAM S: 10-8 | 28 days supply | Qty: 140 | Fill #0

## 2017-04-03 MED FILL — BENZONATATE 100 MG CAPSULE: 100 | 10 days supply | Qty: 30 | Fill #0

## 2017-04-03 NOTE — Patient Instructions (Signed)
Continue to push fluids, practice good hand hygiene, and cover your mouth if you cough.  If you start having fevers, shaking or shortness of breath, seek immediate care.  Air humidifier, nightly Benadryl, saline rinses may be helpful.

## 2017-04-03 NOTE — Progress Notes (Signed)
Pre visit review using our clinic review tool, if applicable. No additional management support is needed unless otherwise documented below in the visit note. 

## 2017-04-03 NOTE — Progress Notes (Signed)
Chief Complaint  Patient presents with  . Cough    congestion    Karen Pierce here for URI complaints.  Duration: 1 week  Associated symptoms: sinus congestion, rhinorrhea and cough Denies: sinus pain, itchy watery eyes, ear pain, ear drainage, sore throat and shortness of breath Treatment to date: essential oils in diffuser Sick contacts: No  ROS:  Const: Denies fevers HEENT: As noted in HPI Lungs: No SOB  Past Medical History:  Diagnosis Date  . Anxiety and depression 11/16/2012  . Breast cyst    dense  . Chicken pox as a kid  . Colon polyp   . GERD (gastroesophageal reflux disease)   . Hiatal hernia   . History of colonic polyps 01/15/2015  . History of shingles 10/04/2012  . Hyperlipidemia   . Kidney stone 10/04/2012  . Other and unspecified hyperlipidemia 10/04/2012  . Overweight 11/29/2016  . Preventative health care 10/04/2012  . Renal artery stenosis (HCC)    kidney stone     BP 120/72 (BP Location: Left Arm, Patient Position: Sitting, Cuff Size: Normal)   Pulse 67   Temp 99.3 F (37.4 C) (Oral)   Ht 5\' 7"  (1.702 m)   Wt 158 lb 4 oz (71.8 kg)   LMP 04/19/2007   SpO2 98%   BMI 24.79 kg/m  General: Awake, alert, appears stated age HEENT: AT, [place], ears patent b/l and TM's neg, nares patent w/o discharge, pharynx pink and without exudates, MMM Neck: No masses or asymmetry Heart: RRR Lungs: CTAB, no accessory muscle use Psych: Age appropriate judgment and insight, normal mood and affect  Acute bronchitis, unspecified organism - Plan: benzonatate (TESSALON) 100 MG capsule, chlorpheniramine-HYDROcodone (TUSSIONEX PENNKINETIC ER) 10-8 MG/5ML SUER  Orders as above. Syrup at night only. Likely viral in nature.  Continue to push fluids, practice good hand hygiene, cover mouth when coughing. F/u prn. If starting to experience fevers, shaking, or shortness of breath, seek immediate care. Pt voiced understanding and agreement to the plan.  [place],  DO 04/03/17 2:33 PM

## 2017-04-26 ENCOUNTER — Other Ambulatory Visit: Payer: Self-pay | Admitting: Emergency Medicine

## 2017-04-26 DIAGNOSIS — M2550 Pain in unspecified joint: Secondary | ICD-10-CM

## 2017-05-03 ENCOUNTER — Other Ambulatory Visit: Payer: Self-pay | Admitting: Family Medicine

## 2017-05-03 MED FILL — ATORVASTATIN 10 MG TABLET: 10 | 30 days supply | Qty: 30 | Fill #0

## 2017-09-14 MED FILL — ATORVASTATIN 10 MG TABLET: 10 | 30 days supply | Qty: 30 | Fill #1

## 2017-11-29 ENCOUNTER — Other Ambulatory Visit: Payer: Self-pay | Admitting: *Deleted

## 2017-11-29 DIAGNOSIS — I701 Atherosclerosis of renal artery: Secondary | ICD-10-CM

## 2017-12-04 ENCOUNTER — Encounter: Payer: Self-pay | Admitting: Family Medicine

## 2017-12-04 ENCOUNTER — Ambulatory Visit (INDEPENDENT_AMBULATORY_CARE_PROVIDER_SITE_OTHER): Payer: PRIVATE HEALTH INSURANCE | Admitting: Family Medicine

## 2017-12-04 VITALS — BP 110/62 | HR 72 | Temp 98.3°F | Resp 18 | Ht 67.0 in | Wt 163.2 lb

## 2017-12-04 DIAGNOSIS — M8580 Other specified disorders of bone density and structure, unspecified site: Secondary | ICD-10-CM | POA: Diagnosis not present

## 2017-12-04 DIAGNOSIS — Z0000 Encounter for general adult medical examination without abnormal findings: Secondary | ICD-10-CM

## 2017-12-04 DIAGNOSIS — E2839 Other primary ovarian failure: Secondary | ICD-10-CM | POA: Diagnosis not present

## 2017-12-04 DIAGNOSIS — Z1231 Encounter for screening mammogram for malignant neoplasm of breast: Secondary | ICD-10-CM | POA: Diagnosis not present

## 2017-12-04 DIAGNOSIS — Z1239 Encounter for other screening for malignant neoplasm of breast: Secondary | ICD-10-CM

## 2017-12-04 DIAGNOSIS — L578 Other skin changes due to chronic exposure to nonionizing radiation: Secondary | ICD-10-CM

## 2017-12-04 DIAGNOSIS — E663 Overweight: Secondary | ICD-10-CM

## 2017-12-04 DIAGNOSIS — E782 Mixed hyperlipidemia: Secondary | ICD-10-CM

## 2017-12-04 LAB — LIPID PANEL
CHOLESTEROL: 183 mg/dL (ref 0–200)
HDL: 83.5 mg/dL (ref >39.00)
LDL CALC: 88 mg/dL (ref 0–99)
NonHDL: 99.16
Total CHOL/HDL Ratio: 2
Triglycerides: 58 mg/dL (ref 0.0–149.0)
VLDL: 11.6 mg/dL (ref 0.0–40.0)

## 2017-12-04 LAB — CBC
HCT: 43.7 % (ref 36.0–46.0)
HEMOGLOBIN: 14.6 g/dL (ref 12.0–15.0)
MCHC: 33.3 g/dL (ref 30.0–36.0)
MCV: 92.7 fl (ref 78.0–100.0)
Platelets: 308 10*3/uL (ref 150.0–400.0)
RBC: 4.71 Mil/uL (ref 3.87–5.11)
RDW: 13 % (ref 11.5–15.5)
WBC: 5.2 10*3/uL (ref 4.0–10.5)

## 2017-12-04 LAB — COMPREHENSIVE METABOLIC PANEL
ALBUMIN: 4.4 g/dL (ref 3.5–5.2)
ALK PHOS: 59 U/L (ref 39–117)
ALT: 15 U/L (ref 0–35)
AST: 16 U/L (ref 0–37)
BILIRUBIN TOTAL: 0.8 mg/dL (ref 0.2–1.2)
BUN: 23 mg/dL (ref 6–23)
CO2: 31 mEq/L (ref 19–32)
Calcium: 9.7 mg/dL (ref 8.4–10.5)
Chloride: 103 mEq/L (ref 96–112)
Creatinine, Ser: 0.83 mg/dL (ref 0.40–1.20)
GFR: 73.34 mL/min (ref >60.00)
GLUCOSE: 98 mg/dL (ref 70–99)
Potassium: 4.2 mEq/L (ref 3.5–5.1)
Sodium: 140 mEq/L (ref 135–145)
TOTAL PROTEIN: 6.5 g/dL (ref 6.0–8.3)

## 2017-12-04 LAB — TSH: TSH: 2.2 u[IU]/mL (ref 0.35–4.50)

## 2017-12-04 MED ORDER — ATORVASTATIN CALCIUM 10 MG PO TABS: ORAL_TABLET | ORAL | 3 refills | Status: DC

## 2017-12-04 MED ORDER — ATORVASTATIN CALCIUM 10 MG PO TABS: ORAL_TABLET | ORAL | 11 refills | Status: DC

## 2017-12-04 MED FILL — ATORVASTATIN CALCIUM 10 MG: 10 | 30 days supply | Qty: 30 | Fill #0

## 2017-12-04 NOTE — Assessment & Plan Note (Signed)
Patient encouraged to maintain heart healthy diet, regular exercise, adequate sleep. Consider daily probiotics. Take medications as prescribed. Labs reviewed. Given and reviewed copy of ACP documents from [place] Secretary of State and encouraged to complete and return 

## 2017-12-04 NOTE — Patient Instructions (Signed)
Shingrix is the new shingles shot 2 shots over 2-6 months, check with insurance regarding coverage then can return for nurse visit to get shot or can get at [place] [facility], Female Preventive care refers to lifestyle choices and visits with your health care provider that can promote health and wellness. What does preventive care include?  A yearly physical exam. This is also called an annual well check.  Dental exams once or twice a year.  Routine eye exams. Ask your health care provider how often you should have your eyes checked.  Personal lifestyle choices, including: ? Daily care of your teeth and gums. ? Regular physical activity. ? Eating a healthy diet. ? Avoiding tobacco and drug use. ? Limiting alcohol use. ? Practicing safe sex. ? Taking low-dose aspirin daily starting at age 48. ? Taking vitamin and mineral supplements as recommended by your health care provider. What happens during an annual well check? The services and screenings done by your health care provider during your annual well check will depend on your age, overall health, lifestyle risk factors, and family history of disease. Counseling Your health care provider may ask you questions about your:  Alcohol use.  Tobacco use.  Drug use.  Emotional well-being.  Home and relationship well-being.  Sexual activity.  Eating habits.  Work and work Statistician.  Method of birth control.  Menstrual cycle.  Pregnancy history.  Screening You may have the following tests or measurements:  Height, weight, and BMI.  Blood pressure.  Lipid and cholesterol levels. These may be checked every 5 years, or more frequently if you are over 64 years old.  Skin check.  Lung cancer screening. You may have this screening every year starting at age 23 if you have a 30-pack-year history of smoking and currently smoke or have quit within the past 15 years.  Fecal occult blood test (FOBT) of  the stool. You may have this test every year starting at age 62.  Flexible sigmoidoscopy or colonoscopy. You may have a sigmoidoscopy every 5 years or a colonoscopy every 10 years starting at age 71.  Hepatitis C blood test.  Hepatitis B blood test.  Sexually transmitted disease (STD) testing.  Diabetes screening. This is done by checking your blood sugar (glucose) after you have not eaten for a while (fasting). You may have this done every 1-3 years.  Mammogram. This may be done every 1-2 years. Talk to your health care provider about when you should start having regular mammograms. This may depend on whether you have a family history of breast cancer.  BRCA-related cancer screening. This may be done if you have a family history of breast, ovarian, tubal, or peritoneal cancers.  Pelvic exam and Pap test. This may be done every 3 years starting at age 21. Starting at age 10, this may be done every 5 years if you have a Pap test in combination with an HPV test.  Bone density scan. This is done to screen for osteoporosis. You may have this scan if you are at high risk for osteoporosis.  Discuss your test results, treatment options, and if necessary, the need for more tests with your health care provider. Vaccines Your health care provider may recommend certain vaccines, such as:  Influenza vaccine. This is recommended every year.  Tetanus, diphtheria, and acellular pertussis (Tdap, Td) vaccine. You may need a Td booster every 10 years.  Varicella vaccine. You may need this if you have not been vaccinated.  Zoster vaccine. You may need this after age 65.  Measles, mumps, and rubella (MMR) vaccine. You may need at least one dose of MMR if you were born in 1957 or later. You may also need a second dose.  Pneumococcal 13-valent conjugate (PCV13) vaccine. You may need this if you have certain conditions and were not previously vaccinated.  Pneumococcal polysaccharide (PPSV23) vaccine.  You may need one or two doses if you smoke cigarettes or if you have certain conditions.  Meningococcal vaccine. You may need this if you have certain conditions.  Hepatitis A vaccine. You may need this if you have certain conditions or if you travel or work in places where you may be exposed to hepatitis A.  Hepatitis B vaccine. You may need this if you have certain conditions or if you travel or work in places where you may be exposed to hepatitis B.  Haemophilus influenzae type b (Hib) vaccine. You may need this if you have certain conditions.  Talk to your health care provider about which screenings and vaccines you need and how often you need them. This information is not intended to replace advice given to you by your health care provider. Make sure you discuss any questions you have with your health care provider. Document Released: 05/01/2015 Document Revised: 12/23/2015 Document Reviewed: 02/03/2015 Elsevier Interactive Patient Education  [employer].

## 2017-12-04 NOTE — Assessment & Plan Note (Signed)
Encouraged heart healthy diet, increase exercise, avoid trans fats, consider a krill oil cap daily 

## 2017-12-04 NOTE — Assessment & Plan Note (Signed)
Encouraged DASH diet, decrease po intake and increase exercise as tolerated. Needs 7-8 hours of sleep nightly. Avoid trans fats, eat small, frequent meals every 4-5 hours with lean proteins, complex carbs and healthy fats. Minimize simple carbs 

## 2017-12-04 NOTE — Progress Notes (Signed)
Subjective:  I acted as a Education administrator for Dr. Charlett Blake. [place], [place]  Patient ID: Karen Pierce, female    DOB: 1950/08/09, 67 y.o.   MRN: [id number]  No chief complaint on file.   HPI  Patient is in today for an annual exam. She is following up on her hyperlipidemia, anxiety and other medical concerns.She has no acute concerns. No recent febrile illness or acute hospitalizations. Denies CP/palp/SOB/HA/congestion/fevers/GI or GU c/o. Taking meds as prescribed    Patient Care Team: [name], MD as PCP - General (Family Medicine) Burnell Blanks, MD as Consulting Physician (Cardiology)   Past Medical History:  Diagnosis Date  . Anxiety and depression 11/16/2012  . Breast cyst    dense  . Chicken pox as a kid  . Colon polyp   . GERD (gastroesophageal reflux disease)   . Hiatal hernia   . History of colonic polyps 01/15/2015  . History of shingles 10/04/2012  . Hyperlipidemia   . Kidney stone 10/04/2012  . Other and unspecified hyperlipidemia 10/04/2012  . Overweight 11/29/2016  . Preventative health care 10/04/2012  . Renal artery stenosis (HCC)    kidney stone    Past Surgical History:  Procedure Laterality Date  . cyst removed off left side  1968   benign, skin  . POLPYPECTOMY  08/2004    Family History  Problem Relation Age of Onset  . Arthritis Mother   . Heart disease Mother   . Cancer Mother 18       breast, sarcoma x 3 bouts in past, under righ arm  . Hyperlipidemia Mother   . Ulcers Father   . Hypertension Father   . Arthritis Father   . Heart disease Father        CHF  . Macular degeneration Father   . Heart disease Maternal Grandmother   . Heart disease Paternal Grandmother   . Heart disease Paternal Grandfather   . Ulcers Paternal Grandfather   . Cancer Brother        pancreatic  . Heart disease Maternal Grandfather   . Heart attack Maternal Grandfather   . Pneumonia Sister   . Arthritis Sister        rheumatoid  . Arthritis Sister    s/p TKR    Social History   Socioeconomic History  . Marital status: Single    Spouse name: Not on file  . Number of children: Not on file  . Years of education: Not on file  . Highest education level: Not on file  Occupational History  . Not on file  Social Needs  . Financial resource strain: Not on file  . Food insecurity:    Worry: Not on file    Inability: Not on file  . Transportation needs:    Medical: Not on file    Non-medical: Not on file  Tobacco Use  . Smoking status: Former Smoker    Packs/day: 0.50    Years: 10.00    Pack years: 5.00    Types: Cigarettes    Start date: 04/18/1988  . Smokeless tobacco: Never Used  Substance and Sexual Activity  . Alcohol use: Yes    Comment: occasionally  . Drug use: No  . Sexual activity: Yes    Partners: Male  Lifestyle  . Physical activity:    Days per week: Not on file    Minutes per session: Not on file  . Stress: Not on file  Relationships  . Social connections:  Talks on phone: Not on file    Gets together: Not on file    Attends religious service: Not on file    Active member of club or organization: Not on file    Attends meetings of clubs or organizations: Not on file    Relationship status: Not on file  . Intimate partner violence:    Fear of current or ex partner: Not on file    Emotionally abused: Not on file    Physically abused: Not on file    Forced sexual activity: Not on file  Other Topics Concern  . Not on file  Social History Narrative  . Not on file    Outpatient Medications Prior to Visit  Medication Sig Dispense Refill  . aspirin 81 MG EC tablet Take 81 mg by mouth daily.      . benzonatate (TESSALON) 100 MG capsule Take 1 capsule (100 mg total) by mouth 3 (three) times daily as needed. 30 capsule 0  . calcium carbonate (TUMS - DOSED IN MG ELEMENTAL CALCIUM) 500 MG chewable tablet Chew 1 tablet by mouth daily.    . Cholecalciferol (VITAMIN D3) 5000 UNITS CAPS Take 1 capsule by mouth  daily.    Astrid Drafts CAPS Take 1 capsule by mouth daily.     . Multiple Vitamins-Minerals (MULTIVITAMIN WITH MINERALS) tablet Take 1 tablet by mouth daily.      . naproxen (NAPROSYN) 500 MG tablet Take 1 tablet (500 mg total) by mouth 2 (two) times daily with a meal. 30 tablet 0  . Probiotic Product (PROBIOTIC DAILY PO) Take 1 capsule by mouth daily.     . tamsulosin (FLOMAX) 0.4 MG CAPS capsule Take 1 capsule (0.4 mg total) by mouth daily after breakfast. 30 capsule 0  . atorvastatin (LIPITOR) 10 MG tablet TAKE 1 TABLET BY MOUTH DAILY 30 tablet 3  . chlorpheniramine-HYDROcodone (TUSSIONEX PENNKINETIC ER) 10-8 MG/5ML SUER Take 5 mLs by mouth at bedtime as needed for cough. 140 mL 0  . traMADol (ULTRAM) 50 MG tablet Take 1 tablet (50 mg total) by mouth every 8 (eight) hours as needed. 30 tablet 0   No facility-administered medications prior to visit.     No Known Allergies  Review of Systems  Constitutional: Negative for chills, fever and malaise/fatigue.  HENT: Negative for congestion and hearing loss.   Eyes: Negative for discharge.  Respiratory: Negative for cough, sputum production and shortness of breath.   Cardiovascular: Negative for chest pain, palpitations and leg swelling.  Gastrointestinal: Negative for abdominal pain, blood in stool, constipation, diarrhea, heartburn, nausea and vomiting.  Genitourinary: Negative for dysuria, frequency, hematuria and urgency.  Musculoskeletal: Negative for back pain, falls and myalgias.  Skin: Negative for rash.  Neurological: Negative for dizziness, sensory change, loss of consciousness, weakness and headaches.  Endo/Heme/Allergies: Negative for environmental allergies. Does not bruise/bleed easily.  Psychiatric/Behavioral: Negative for depression and suicidal ideas. The patient is not nervous/anxious and does not have insomnia.        Objective:    Physical Exam  Constitutional: She is oriented to person, place, and time. She appears  well-developed and well-nourished. No distress.  HENT:  Head: Normocephalic and atraumatic.  Eyes: Conjunctivae are normal.  Neck: Neck supple. No thyromegaly present.  Cardiovascular: Normal rate, regular rhythm and normal heart sounds.  No murmur heard. Pulmonary/Chest: Effort normal and breath sounds normal. No respiratory distress.  Abdominal: Soft. Bowel sounds are normal. She exhibits no distension and no mass. There is no  tenderness.  Musculoskeletal: She exhibits no edema.  Lymphadenopathy:    She has no cervical adenopathy.  Neurological: She is alert and oriented to person, place, and time.  Skin: Skin is warm and dry.  Psychiatric: She has a normal mood and affect. Her behavior is normal.    BP 110/62 (BP Location: Left Arm, Patient Position: Sitting, Cuff Size: Normal)   Pulse 72   Temp 98.3 F (36.8 C) (Oral)   Resp 18   Ht 5\' 7"  (1.702 m)   Wt 163 lb 3.2 oz (74 kg)   LMP 04/19/2007   SpO2 98%   BMI 25.56 kg/m  Wt Readings from Last 3 Encounters:  12/04/17 163 lb 3.2 oz (74 kg)  04/03/17 158 lb 4 oz (71.8 kg)  01/04/17 166 lb 3.2 oz (75.4 kg)   BP Readings from Last 3 Encounters:  12/04/17 110/62  04/03/17 120/72  01/04/17 (!) 111/49     Immunization History  Administered Date(s) Administered  . Influenza Split 01/27/2012  . Influenza-Unspecified 02/02/2015  . Td 10/24/1997  . Tdap 07/21/2008  . Zoster 01/15/2015    Health Maintenance  Topic Date Due  . INFLUENZA VACCINE  11/16/2017  . TETANUS/TDAP  07/22/2018  . MAMMOGRAM  12/28/2018  . PAP SMEAR  11/30/2019  . COLONOSCOPY  08/27/2025  . Hepatitis C Screening  Completed  . HIV Screening  Completed    Lab Results  Component Value Date   WBC 5.2 12/04/2017   HGB 14.6 12/04/2017   HCT 43.7 12/04/2017   PLT 308.0 12/04/2017   GLUCOSE 98 12/04/2017   CHOL 183 12/04/2017   TRIG 58.0 12/04/2017   HDL 83.50 12/04/2017   LDLCALC 88 12/04/2017   ALT 15 12/04/2017   AST 16 12/04/2017   NA 140  12/04/2017   K 4.2 12/04/2017   CL 103 12/04/2017   CREATININE 0.83 12/04/2017   BUN 23 12/04/2017   CO2 31 12/04/2017   TSH 2.20 12/04/2017   HGBA1C 5.3 11/07/2012    Lab Results  Component Value Date   TSH 2.20 12/04/2017   Lab Results  Component Value Date   WBC 5.2 12/04/2017   HGB 14.6 12/04/2017   HCT 43.7 12/04/2017   MCV 92.7 12/04/2017   PLT 308.0 12/04/2017   Lab Results  Component Value Date   NA 140 12/04/2017   K 4.2 12/04/2017   CO2 31 12/04/2017   GLUCOSE 98 12/04/2017   BUN 23 12/04/2017   CREATININE 0.83 12/04/2017   BILITOT 0.8 12/04/2017   ALKPHOS 59 12/04/2017   AST 16 12/04/2017   ALT 15 12/04/2017   PROT 6.5 12/04/2017   ALBUMIN 4.4 12/04/2017   CALCIUM 9.7 12/04/2017   GFR 73.34 12/04/2017   Lab Results  Component Value Date   CHOL 183 12/04/2017   Lab Results  Component Value Date   HDL 83.50 12/04/2017   Lab Results  Component Value Date   LDLCALC 88 12/04/2017   Lab Results  Component Value Date   TRIG 58.0 12/04/2017   Lab Results  Component Value Date   CHOLHDL 2 12/04/2017   Lab Results  Component Value Date   HGBA1C 5.3 11/07/2012         Assessment & Plan:   Problem List Items Addressed This Visit    Preventative health care    Patient encouraged to maintain heart healthy diet, regular exercise, adequate sleep. Consider daily probiotics. Take medications as prescribed. Labs reviewed. Given and reviewed copy of ACP  documents from [employer] and encouraged to complete and return      Relevant Orders   TSH (Completed)   CBC (Completed)   Comprehensive metabolic panel (Completed)   Hyperlipidemia, mixed    Encouraged heart healthy diet, increase exercise, avoid trans fats, consider a krill oil cap daily      Relevant Medications   atorvastatin (LIPITOR) 10 MG tablet   Other Relevant Orders   Lipid panel (Completed)   Overweight    Encouraged DASH diet, decrease po intake and increase exercise  as tolerated. Needs 7-8 hours of sleep nightly. Avoid trans fats, eat small, frequent meals every 4-5 hours with lean proteins, complex carbs and healthy fats. Minimize simple carbs       Other Visit Diagnoses    Breast cancer screening    -  Primary   Relevant Orders   MM 3D SCREEN BREAST BILATERAL   Osteopenia, unspecified location       Estrogen deficiency       Relevant Orders   DG Bone Density   Sun-damaged skin       Relevant Orders   Ambulatory referral to Dermatology      I have discontinued Lydia Guiles. Penning's traMADol and chlorpheniramine-HYDROcodone. I am also having her maintain her aspirin, multivitamin with minerals, calcium carbonate, Vitamin D3, Krill Oil, Probiotic Product (PROBIOTIC DAILY PO), naproxen, tamsulosin, benzonatate, and atorvastatin.  Meds ordered this encounter  Medications  . DISCONTD: atorvastatin (LIPITOR) 10 MG tablet    Sig: TAKE 1 TABLET BY MOUTH DAILY    Dispense:  30 tablet    Refill:  3  . atorvastatin (LIPITOR) 10 MG tablet    Sig: TAKE 1 TABLET BY MOUTH DAILY    Dispense:  30 tablet    Refill:  11    CMA served as scribe during this visit. History, Physical and Plan performed by medical provider. Documentation and orders reviewed and attested to.  Penni Homans, MD

## 2018-01-01 ENCOUNTER — Ambulatory Visit (HOSPITAL_BASED_OUTPATIENT_CLINIC_OR_DEPARTMENT_OTHER)
Admission: RE | Admit: 2018-01-01 | Discharge: 2018-01-01 | Disposition: A | Discharge status: Home / Self Care | Payer: PRIVATE HEALTH INSURANCE | Source: Ambulatory Visit | Attending: Family Medicine | Admitting: Family Medicine

## 2018-01-01 DIAGNOSIS — E2839 Other primary ovarian failure: Secondary | ICD-10-CM

## 2018-01-01 DIAGNOSIS — Z1231 Encounter for screening mammogram for malignant neoplasm of breast: Secondary | ICD-10-CM | POA: Insufficient documentation

## 2018-01-01 DIAGNOSIS — Z1239 Encounter for other screening for malignant neoplasm of breast: Secondary | ICD-10-CM

## 2018-01-25 NOTE — Progress Notes (Signed)
Chief Complaint  Patient presents with  . Follow-up    Renal artery stenosis   History of Present Illness: 67 yo female with history of hyperlipidemia and renal artery stenosis with normal BP and normal renal function here today for PV follow up. She has been followed in the past by Dr. Gwenlyn Found. Most recent renal artery dopplers in December 2017 with normal velocities suggesting no obstructive renal artery stenosis. Her ABIs were normal on the right and left in December 2013. She has had a normal stress Myoview in September 2014.   She is here today for follow up. The patient denies any chest pain, dyspnea, palpitations, lower extremity edema, orthopnea, PND, dizziness, near syncope or syncope. She feels great.    Primary Care Physician: [name], MD  Past Medical History:  Diagnosis Date  . Anxiety and depression 11/16/2012  . Breast cyst    dense  . Chicken pox as a kid  . Colon polyp   . GERD (gastroesophageal reflux disease)   . Hiatal hernia   . History of colonic polyps 01/15/2015  . History of shingles 10/04/2012  . Hyperlipidemia   . Kidney stone 10/04/2012  . Other and unspecified hyperlipidemia 10/04/2012  . Overweight 11/29/2016  . Preventative health care 10/04/2012  . Renal artery stenosis (HCC)    kidney stone    Past Surgical History:  Procedure Laterality Date  . cyst removed off left side  1968   benign, skin  . POLPYPECTOMY  08/2004    Current Outpatient Medications  Medication Sig Dispense Refill  . aspirin 81 MG EC tablet Take 81 mg by mouth daily.      Marland Kitchen atorvastatin (LIPITOR) 10 MG tablet TAKE 1 TABLET BY MOUTH DAILY 30 tablet 11  . calcium carbonate (TUMS - DOSED IN MG ELEMENTAL CALCIUM) 500 MG chewable tablet Chew 1 tablet by mouth daily.    . Cholecalciferol (VITAMIN D3) 5000 UNITS CAPS Take 1 capsule by mouth daily.    Astrid Drafts CAPS Take 1 capsule by mouth daily.     . Multiple Vitamins-Minerals (MULTIVITAMIN WITH MINERALS) tablet Take 1  tablet by mouth daily.      . Probiotic Product (PROBIOTIC DAILY PO) Take 1 capsule by mouth daily.     . tamsulosin (FLOMAX) 0.4 MG CAPS capsule Take 1 capsule (0.4 mg total) by mouth daily after breakfast. 30 capsule 0   No current facility-administered medications for this visit.     No Known Allergies  Social History   Socioeconomic History  . Marital status: Single    Spouse name: Not on file  . Number of children: Not on file  . Years of education: Not on file  . Highest education level: Not on file  Occupational History  . Not on file  Social Needs  . Financial resource strain: Not on file  . Food insecurity:    Worry: Not on file    Inability: Not on file  . Transportation needs:    Medical: Not on file    Non-medical: Not on file  Tobacco Use  . Smoking status: Former Smoker    Packs/day: 0.50    Years: 10.00    Pack years: 5.00    Types: Cigarettes    Start date: 04/18/1988  . Smokeless tobacco: Never Used  Substance and Sexual Activity  . Alcohol use: Yes    Comment: occasionally  . Drug use: No  . Sexual activity: Yes    Partners: Male  Lifestyle  . Physical activity:    Days per week: Not on file    Minutes per session: Not on file  . Stress: Not on file  Relationships  . Social connections:    Talks on phone: Not on file    Gets together: Not on file    Attends religious service: Not on file    Active member of club or organization: Not on file    Attends meetings of clubs or organizations: Not on file    Relationship status: Not on file  . Intimate partner violence:    Fear of current or ex partner: Not on file    Emotionally abused: Not on file    Physically abused: Not on file    Forced sexual activity: Not on file  Other Topics Concern  . Not on file  Social History Narrative  . Not on file    Family History  Problem Relation Age of Onset  . Arthritis Mother   . Heart disease Mother   . Cancer Mother 46       breast, sarcoma x 3  bouts in past, under righ arm  . Hyperlipidemia Mother   . Ulcers Father   . Hypertension Father   . Arthritis Father   . Heart disease Father        CHF  . Macular degeneration Father   . Heart disease Maternal Grandmother   . Heart disease Paternal Grandmother   . Heart disease Paternal Grandfather   . Ulcers Paternal Grandfather   . Cancer Brother        pancreatic  . Heart disease Maternal Grandfather   . Heart attack Maternal Grandfather   . Pneumonia Sister   . Arthritis Sister        rheumatoid  . Arthritis Sister        s/p TKR    Review of Systems:  As stated in the HPI and otherwise negative.   BP 124/80   Pulse 66   Ht 5\' 7"  (1.702 m)   Wt 166 lb 6.4 oz (75.5 kg)   LMP 04/19/2007   BMI 26.06 kg/m   Physical Examination: General: Well developed, well nourished, NAD  HEENT: OP clear, mucus membranes moist  SKIN: warm, dry. No rashes. Neuro: No focal deficits  Musculoskeletal: Muscle strength 5/5 all ext  Psychiatric: Mood and affect normal  Neck: No JVD, no carotid bruits, no thyromegaly, no lymphadenopathy.  Lungs:Clear bilaterally, no wheezes, rhonci, crackles Cardiovascular: Regular rate and rhythm. No murmurs, gallops or rubs. Abdomen:Soft. Bowel sounds present. Non-tender.  Extremities: No lower extremity edema. Pulses are 2 + in the bilateral DP/PT.  EKG:  EKG is ordered today. The ekg ordered today demonstrates NSR, rate 66 bpm.   Recent Labs: 12/04/2017: ALT 15; BUN 23; Creatinine, Ser 0.83; Hemoglobin 14.6; Platelets 308.0; Potassium 4.2; Sodium 140; TSH 2.20   Lipid Panel    Component Value Date/Time   CHOL 183 12/04/2017 0922   TRIG 58.0 12/04/2017 0922   HDL 83.50 12/04/2017 0922   CHOLHDL 2 12/04/2017 0922   VLDL 11.6 12/04/2017 0922   LDLCALC 88 12/04/2017 0922     Wt Readings from Last 3 Encounters:  01/26/18 166 lb 6.4 oz (75.5 kg)  12/04/17 163 lb 3.2 oz (74 kg)  04/03/17 158 lb 4 oz (71.8 kg)     Other studies  Reviewed: Additional studies/ records that were reviewed today include: . Review of the above records demonstrates:    Assessment and  Plan:   1. Renal artery stenosis: Renal dopplers in 2017 with no obstructive disease in either renal artery. Will repeat now given history.    2. HLD: Continue statin.   Current medicines are reviewed at length with the patient today.  The patient does not have concerns regarding medicines.  The following changes have been made:  no change  Labs/ tests ordered today include:   Orders Placed This Encounter  Procedures  . EKG 12-Lead    Disposition:   FU with me in 2 years.     Signed, Lauree Chandler, MD 01/26/2018 1:06 PM    [facility] [address], [place], [place]  [address] Phone: [phone]; Fax: [phone]

## 2018-01-26 ENCOUNTER — Ambulatory Visit: Payer: PRIVATE HEALTH INSURANCE | Admitting: Cardiovascular Disease

## 2018-01-26 ENCOUNTER — Encounter: Payer: Self-pay | Admitting: Cardiovascular Disease

## 2018-01-26 VITALS — BP 124/80 | HR 66 | Ht 67.0 in | Wt 166.4 lb

## 2018-01-26 DIAGNOSIS — I701 Atherosclerosis of renal artery: Secondary | ICD-10-CM | POA: Diagnosis not present

## 2018-01-26 DIAGNOSIS — E7800 Pure hypercholesterolemia, unspecified: Secondary | ICD-10-CM

## 2018-01-26 NOTE — Patient Instructions (Signed)
Medication Instructions:  Your physician recommends that you continue on your current medications as directed. Please refer to the Current Medication list given to you today.  If you need a refill on your cardiac medications before your next appointment, please call your pharmacy.   Lab work: none If you have labs (blood work) drawn today and your tests are completely normal, you will receive your results only by: Marland Kitchen MyChart Message (if you have MyChart) OR . A paper copy in the mail If you have any lab test that is abnormal or we need to change your treatment, we will call you to review the results.  Testing/Procedures: Renal artery doppler as planned  Follow-Up: At [facility], you and your health needs are our priority.  As part of our continuing mission to provide you with exceptional heart care, we have created designated Provider Care Teams.  These Care Teams include your primary Cardiologist (physician) and Advanced Practice Providers (APPs -  Physician Assistants and Nurse Practitioners) who all work together to provide you with the care you need, when you need it. You will need a follow up appointment in 2 years.  Please call our office 2 months in advance to schedule this appointment.  You may see Dr. Angelena Form or one of the following Advanced Practice Providers on your designated Care Team:   Weir, PA-C Melina Copa, PA-C . Ermalinda Barrios, PA-C  Any Other Special Instructions Will Be Listed Below (If Applicable).

## 2018-03-02 MED FILL — ATORVASTATIN 10 MG TABLET: 10 | 30 days supply | Qty: 30 | Fill #1

## 2018-03-07 ENCOUNTER — Ambulatory Visit (HOSPITAL_COMMUNITY)
Admission: RE | Admit: 2018-03-07 | Discharge: 2018-03-07 | Disposition: A | Discharge status: Home / Self Care | Payer: PRIVATE HEALTH INSURANCE | Source: Ambulatory Visit | Attending: Internal Medicine | Admitting: Internal Medicine

## 2018-03-07 DIAGNOSIS — I701 Atherosclerosis of renal artery: Secondary | ICD-10-CM

## 2018-03-09 ENCOUNTER — Other Ambulatory Visit: Payer: Self-pay | Admitting: *Deleted

## 2018-03-09 DIAGNOSIS — I10 Essential (primary) hypertension: Secondary | ICD-10-CM

## 2018-03-09 DIAGNOSIS — I701 Atherosclerosis of renal artery: Secondary | ICD-10-CM

## 2018-03-14 ENCOUNTER — Ambulatory Visit: Payer: PRIVATE HEALTH INSURANCE | Admitting: Cardiovascular Disease

## 2018-03-30 ENCOUNTER — Ambulatory Visit: Payer: PRIVATE HEALTH INSURANCE | Admitting: Internal Medicine

## 2018-03-30 ENCOUNTER — Encounter: Payer: Self-pay | Admitting: Internal Medicine

## 2018-03-30 VITALS — BP 118/74 | HR 71 | Temp 98.2°F | Resp 16 | Ht 67.0 in | Wt 166.5 lb

## 2018-03-30 DIAGNOSIS — H9201 Otalgia, right ear: Secondary | ICD-10-CM

## 2018-03-30 DIAGNOSIS — M542 Cervicalgia: Secondary | ICD-10-CM

## 2018-03-30 NOTE — Patient Instructions (Signed)
Please call if no better in few days

## 2018-03-30 NOTE — Progress Notes (Signed)
Pre visit review using our clinic review tool, if applicable. No additional management support is needed unless otherwise documented below in the visit note. 

## 2018-03-30 NOTE — Progress Notes (Signed)
Subjective:    Patient ID: Karen Pierce, female    DOB: [birth year], 67 y.o.   MRN: [id number]  DOS:  03/30/2018 Type of visit - description : acute Symptoms of started approximately 10 days ago, she used a Q-tip to clean her right ear as she usually does but she saw some blood on the Q-tip. Since then has had a "minor" but persistent discomfort at the ear.  Also, having mild pain on the right side of the neck mostly when she turns her head to the right.  Some radiation to the right trapezoid area.   Review of Systems No URI symptoms.  No ear discharge, no hearing impairment. No upper or lower extremity paresthesias.  Past Medical History:  Diagnosis Date  . Anxiety and depression 11/16/2012  . Breast cyst    dense  . Chicken pox as a kid  . Colon polyp   . GERD (gastroesophageal reflux disease)   . Hiatal hernia   . History of colonic polyps 01/15/2015  . History of shingles 10/04/2012  . Hyperlipidemia   . Kidney stone 10/04/2012  . Other and unspecified hyperlipidemia 10/04/2012  . Overweight 11/29/2016  . Preventative health care 10/04/2012  . Renal artery stenosis (HCC)    kidney stone    Past Surgical History:  Procedure Laterality Date  . cyst removed off left side  1968   benign, skin  . POLPYPECTOMY  08/2004    Social History   Socioeconomic History  . Marital status: Single    Spouse name: Not on file  . Number of children: Not on file  . Years of education: Not on file  . Highest education level: Not on file  Occupational History  . Not on file  Social Needs  . Financial resource strain: Not on file  . Food insecurity:    Worry: Not on file    Inability: Not on file  . Transportation needs:    Medical: Not on file    Non-medical: Not on file  Tobacco Use  . Smoking status: Former Smoker    Packs/day: 0.50    Years: 10.00    Pack years: 5.00    Types: Cigarettes    Start date: 04/18/1988  . Smokeless tobacco: Never Used  Substance and Sexual  Activity  . Alcohol use: Yes    Comment: occasionally  . Drug use: No  . Sexual activity: Yes    Partners: Male  Lifestyle  . Physical activity:    Days per week: Not on file    Minutes per session: Not on file  . Stress: Not on file  Relationships  . Social connections:    Talks on phone: Not on file    Gets together: Not on file    Attends religious service: Not on file    Active member of club or organization: Not on file    Attends meetings of clubs or organizations: Not on file    Relationship status: Not on file  . Intimate partner violence:    Fear of current or ex partner: Not on file    Emotionally abused: Not on file    Physically abused: Not on file    Forced sexual activity: Not on file  Other Topics Concern  . Not on file  Social History Narrative  . Not on file      Allergies as of 03/30/2018   No Known Allergies     Medication List  Accurate as of March 30, 2018  5:17 PM. Always use your most recent med list.        aspirin 81 MG EC tablet Take 81 mg by mouth daily.   atorvastatin 10 MG tablet Commonly known as:  LIPITOR TAKE 1 TABLET BY MOUTH DAILY   calcium carbonate 500 MG chewable tablet Commonly known as:  TUMS - dosed in mg elemental calcium Chew 1 tablet by mouth daily.   Krill Oil Caps Take 1 capsule by mouth daily.   multivitamin with minerals tablet Take 1 tablet by mouth daily.   PROBIOTIC DAILY PO Take 1 capsule by mouth daily.   Vitamin D3 125 MCG (5000 UT) Caps Take 1 capsule by mouth daily.           Objective:   Physical Exam BP 118/74 (BP Location: Left Arm, Patient Position: Sitting, Cuff Size: Small)   Pulse 71   Temp 98.2 F (36.8 C) (Oral)   Resp 16   Ht 5\' 7"  (1.702 m)   Wt 166 lb 8 oz (75.5 kg)   LMP 04/19/2007   SpO2 98%   BMI 26.08 kg/m  General:   Well developed, NAD, BMI noted. HEENT:  Normocephalic . Face symmetric, atraumatic. Right ear: Canal with no swelling, at the posterior  aspect there is area where I see either a small amount of wax or a small amount of dried blood.  Otherwise exam is completely normal. Left ear: Normal Throat symmetric and not red Neck: No mass or LAD is.  Range of motion normal. Neurologic:  alert & oriented X3.  Speech normal, gait appropriate for age and unassisted. Motor and DTR symmetric Psych--  Cognition and judgment appear intact.  Cooperative with normal attention span and concentration.  Behavior appropriate. No anxious or depressed appearing.      Assessment & Plan:    67 year old female.  PMH includes high cholesterol, GERD, kidney stones, presents with  Right otalgia: Probably from minor injury due to the Q-tip.  Recommend observation, call if not improving Right neck pain: As described above, no red flag symptoms, recommend Tylenol, local heat, call if not gradually improving.

## 2018-05-02 DIAGNOSIS — L570 Actinic keratosis: Secondary | ICD-10-CM | POA: Diagnosis not present

## 2018-05-11 DIAGNOSIS — J309 Allergic rhinitis, unspecified: Secondary | ICD-10-CM | POA: Diagnosis not present

## 2018-05-11 DIAGNOSIS — J00 Acute nasopharyngitis [common cold]: Secondary | ICD-10-CM | POA: Diagnosis not present

## 2018-05-11 DIAGNOSIS — J029 Acute pharyngitis, unspecified: Secondary | ICD-10-CM | POA: Diagnosis not present

## 2018-06-14 MED FILL — ATORVASTATIN 10 MG TABLET: 10 | 30 days supply | Qty: 30 | Fill #2

## 2018-07-09 DIAGNOSIS — M25532 Pain in left wrist: Secondary | ICD-10-CM | POA: Diagnosis not present

## 2018-07-16 MED FILL — ATORVASTATIN 10 MG TABLET: 10 | 30 days supply | Qty: 30 | Fill #3

## 2018-07-24 DIAGNOSIS — M25532 Pain in left wrist: Secondary | ICD-10-CM | POA: Diagnosis not present

## 2018-08-07 DIAGNOSIS — S52591A Other fractures of lower end of right radius, initial encounter for closed fracture: Secondary | ICD-10-CM | POA: Diagnosis not present

## 2018-08-07 DIAGNOSIS — M25531 Pain in right wrist: Secondary | ICD-10-CM | POA: Diagnosis not present

## 2018-08-16 MED FILL — ATORVASTATIN 10 MG TABLET: 10 | 30 days supply | Qty: 30 | Fill #0

## 2018-09-04 DIAGNOSIS — M25531 Pain in right wrist: Secondary | ICD-10-CM | POA: Diagnosis not present

## 2018-09-24 MED FILL — ATORVASTATIN 10 MG TABLET: 10 | 30 days supply | Qty: 30 | Fill #1

## 2018-11-28 MED FILL — ATORVASTATIN 10 MG TABLET: 10 | 30 days supply | Qty: 30 | Fill #2

## 2019-01-07 ENCOUNTER — Other Ambulatory Visit: Payer: Self-pay | Admitting: Family Medicine

## 2019-01-07 MED FILL — ATORVASTATIN 10 MG TABLET: 10 | 30 days supply | Qty: 30 | Fill #0

## 2019-01-10 DIAGNOSIS — Z23 Encounter for immunization: Secondary | ICD-10-CM | POA: Diagnosis not present

## 2019-02-12 MED FILL — ATORVASTATIN 10 MG TABLET: 10 | 30 days supply | Qty: 30 | Fill #1

## 2019-02-21 ENCOUNTER — Other Ambulatory Visit (HOSPITAL_BASED_OUTPATIENT_CLINIC_OR_DEPARTMENT_OTHER): Payer: Self-pay | Admitting: Family Medicine

## 2019-02-21 DIAGNOSIS — Z1231 Encounter for screening mammogram for malignant neoplasm of breast: Secondary | ICD-10-CM

## 2019-03-05 ENCOUNTER — Other Ambulatory Visit: Payer: Self-pay

## 2019-03-05 ENCOUNTER — Ambulatory Visit (HOSPITAL_BASED_OUTPATIENT_CLINIC_OR_DEPARTMENT_OTHER)
Admission: RE | Admit: 2019-03-05 | Discharge: 2019-03-05 | Disposition: A | Discharge status: Home / Self Care | Payer: Medicare Other | Source: Ambulatory Visit | Attending: Family Medicine | Admitting: Family Medicine

## 2019-03-05 DIAGNOSIS — Z1231 Encounter for screening mammogram for malignant neoplasm of breast: Secondary | ICD-10-CM | POA: Diagnosis not present

## 2019-03-21 ENCOUNTER — Other Ambulatory Visit: Payer: Self-pay

## 2019-03-21 DIAGNOSIS — Z20828 Contact with and (suspected) exposure to other viral communicable diseases: Secondary | ICD-10-CM | POA: Diagnosis not present

## 2019-03-21 DIAGNOSIS — Z20822 Contact with and (suspected) exposure to covid-19: Secondary | ICD-10-CM

## 2019-03-25 LAB — NOVEL CORONAVIRUS, NAA: SARS-CoV-2, NAA: NOT DETECTED

## 2019-04-08 MED FILL — ATORVASTATIN 10 MG TABLET: 10 | 30 days supply | Qty: 30 | Fill #2

## 2019-04-25 ENCOUNTER — Ambulatory Visit: Payer: PRIVATE HEALTH INSURANCE | Attending: Internal Medicine

## 2019-04-25 DIAGNOSIS — Z20822 Contact with and (suspected) exposure to covid-19: Secondary | ICD-10-CM | POA: Diagnosis not present

## 2019-04-27 LAB — NOVEL CORONAVIRUS, NAA: SARS-CoV-2, NAA: DETECTED — AB

## 2019-04-29 ENCOUNTER — Telehealth: Payer: Self-pay | Admitting: *Deleted

## 2019-04-29 NOTE — Telephone Encounter (Signed)
Copied from [place] [id number]. Topic: General - Inquiry >> Apr 29, 2019 10:24 AM Virl Axe D wrote: Reason for CRM: Pt stated she received a vm from [facility] regarding her Covid 19 results. Requesting CB. Please advise.

## 2019-04-29 NOTE — Telephone Encounter (Signed)
Called patient again about results no answer, looks like she may have seen them if she calls back, Dr. Charlett Blake just wants to know if she would like a virtual visit with pcp to discuss symptoms and managing them

## 2019-05-14 MED FILL — ATORVASTATIN 10 MG TABLET: 10 | 30 days supply | Qty: 30 | Fill #3

## 2019-06-16 ENCOUNTER — Ambulatory Visit: Payer: PRIVATE HEALTH INSURANCE

## 2019-06-17 DIAGNOSIS — H43811 Vitreous degeneration, right eye: Secondary | ICD-10-CM | POA: Diagnosis not present

## 2019-06-17 DIAGNOSIS — H04123 Dry eye syndrome of bilateral lacrimal glands: Secondary | ICD-10-CM | POA: Diagnosis not present

## 2019-06-17 DIAGNOSIS — H0102A Squamous blepharitis right eye, upper and lower eyelids: Secondary | ICD-10-CM | POA: Diagnosis not present

## 2019-06-17 DIAGNOSIS — Z83518 Family history of other specified eye disorder: Secondary | ICD-10-CM | POA: Diagnosis not present

## 2019-06-17 DIAGNOSIS — H0102B Squamous blepharitis left eye, upper and lower eyelids: Secondary | ICD-10-CM | POA: Diagnosis not present

## 2019-06-17 DIAGNOSIS — H2513 Age-related nuclear cataract, bilateral: Secondary | ICD-10-CM | POA: Diagnosis not present

## 2019-07-01 MED FILL — ATORVASTATIN 10 MG TABLET: 10 | 30 days supply | Qty: 30 | Fill #4

## 2019-07-25 ENCOUNTER — Ambulatory Visit: Payer: PRIVATE HEALTH INSURANCE | Attending: Internal Medicine

## 2019-07-25 DIAGNOSIS — Z23 Encounter for immunization: Secondary | ICD-10-CM

## 2019-07-25 NOTE — Progress Notes (Signed)
   Covid-19 Vaccination Clinic  Name:  Karen Pierce    MRN: [id number] DOB: 1950-10-22  07/25/2019  [name] was observed post Covid-19 immunization for 15 minutes without incident. She was provided with Vaccine Information Sheet and instruction to access the V-Safe system.   [name] was instructed to call 911 with any severe reactions post vaccine: Marland Kitchen Difficulty breathing  . Swelling of face and throat  . A fast heartbeat  . A bad rash all over body  . Dizziness and weakness   Immunizations Administered    Name Date Dose VIS Date Route   Pfizer COVID-19 Vaccine 07/25/2019  9:07 AM 0.3 mL 03/29/2019 Intramuscular   Manufacturer: [place]   Lot: [id number]   [place]: [id number]

## 2019-08-14 MED FILL — ATORVASTATIN 10 MG TABLET: 10 | 30 days supply | Qty: 30 | Fill #5

## 2019-08-20 ENCOUNTER — Ambulatory Visit: Payer: PRIVATE HEALTH INSURANCE | Attending: Internal Medicine

## 2019-08-20 DIAGNOSIS — Z23 Encounter for immunization: Secondary | ICD-10-CM

## 2019-08-20 NOTE — Progress Notes (Signed)
   Covid-19 Vaccination Clinic  Name:  Karen Pierce    MRN: [id number] DOB: [birth year]  08/20/2019  [name] was observed post Covid-19 immunization for 15 minutes without incident. She was provided with Vaccine Information Sheet and instruction to access the V-Safe system.   [name] was instructed to call 911 with any severe reactions post vaccine: Marland Kitchen Difficulty breathing  . Swelling of face and throat  . A fast heartbeat  . A bad rash all over body  . Dizziness and weakness   Immunizations Administered    Name Date Dose VIS Date Route   Pfizer COVID-19 Vaccine 08/20/2019  8:56 AM 0.3 mL 06/12/2018 Intramuscular   Manufacturer: [place]   Lot: [id number]   [place]: [id number]

## 2019-10-10 MED FILL — ATORVASTATIN CALCIUM 10 MG: 10 | 30 days supply | Qty: 30 | Fill #6

## 2020-01-24 ENCOUNTER — Other Ambulatory Visit (HOSPITAL_BASED_OUTPATIENT_CLINIC_OR_DEPARTMENT_OTHER): Payer: Self-pay | Admitting: Family Medicine

## 2020-01-24 DIAGNOSIS — Z1231 Encounter for screening mammogram for malignant neoplasm of breast: Secondary | ICD-10-CM

## 2020-03-09 ENCOUNTER — Encounter (HOSPITAL_BASED_OUTPATIENT_CLINIC_OR_DEPARTMENT_OTHER): Payer: Self-pay

## 2020-03-09 ENCOUNTER — Other Ambulatory Visit: Payer: Self-pay

## 2020-03-09 ENCOUNTER — Ambulatory Visit (HOSPITAL_BASED_OUTPATIENT_CLINIC_OR_DEPARTMENT_OTHER)
Admission: RE | Admit: 2020-03-09 | Discharge: 2020-03-09 | Disposition: A | Discharge status: Home / Self Care | Payer: Medicare Other | Source: Ambulatory Visit | Attending: Family Medicine | Admitting: Family Medicine

## 2020-03-09 DIAGNOSIS — Z1231 Encounter for screening mammogram for malignant neoplasm of breast: Secondary | ICD-10-CM | POA: Diagnosis not present

## 2020-03-24 ENCOUNTER — Ambulatory Visit: Payer: PRIVATE HEALTH INSURANCE | Attending: Internal Medicine

## 2020-03-24 ENCOUNTER — Other Ambulatory Visit (HOSPITAL_BASED_OUTPATIENT_CLINIC_OR_DEPARTMENT_OTHER): Payer: Self-pay | Admitting: Internal Medicine

## 2020-03-24 DIAGNOSIS — Z23 Encounter for immunization: Secondary | ICD-10-CM

## 2020-03-24 NOTE — Progress Notes (Signed)
   Covid-19 Vaccination Clinic  Name:  Karen Pierce    MRN: [id number] DOB: 1950-07-11  03/24/2020  [name] was observed post Covid-19 immunization for 15 minutes without incident. She was provided with Vaccine Information Sheet and instruction to access the V-Safe system.   [name] was instructed to call 911 with any severe reactions post vaccine: Marland Kitchen Difficulty breathing  . Swelling of face and throat  . A fast heartbeat  . A bad rash all over body  . Dizziness and weakness   Immunizations Administered    Name Date Dose VIS Date Route   Pfizer COVID-19 Vaccine 03/24/2020 10:41 AM 0.3 mL 02/05/2020 Intramuscular   Manufacturer: [place]   Lot: [id number]   [place]: [id number]

## 2020-03-31 MED FILL — PFIZER-BIONTECH COVID-19 VA: 30 | 1 days supply | Qty: 0 | Fill #0

## 2020-04-06 ENCOUNTER — Other Ambulatory Visit: Payer: Self-pay | Admitting: Family Medicine

## 2020-04-28 ENCOUNTER — Other Ambulatory Visit: Payer: Self-pay

## 2020-04-28 ENCOUNTER — Ambulatory Visit (HOSPITAL_COMMUNITY)
Admission: RE | Admit: 2020-04-28 | Discharge: 2020-04-28 | Disposition: A | Discharge status: Home / Self Care | Payer: Medicare Other | Source: Ambulatory Visit | Attending: Cardiovascular Disease | Admitting: Cardiovascular Disease

## 2020-04-28 DIAGNOSIS — I701 Atherosclerosis of renal artery: Secondary | ICD-10-CM

## 2020-04-28 DIAGNOSIS — I10 Essential (primary) hypertension: Secondary | ICD-10-CM | POA: Diagnosis not present

## 2020-05-03 NOTE — Progress Notes (Signed)
Virtual Visit via Video Note   This visit type was conducted due to national recommendations for restrictions regarding the COVID-19 Pandemic (e.g. social distancing) in an effort to limit this patient's exposure and mitigate transmission in our community.  Due to her co-morbid illnesses, this patient is at least at moderate risk for complications without adequate follow up.  This format is felt to be most appropriate for this patient at this time.  All issues noted in this document were discussed and addressed.  A limited physical exam was performed with this format.  Please refer to the patient's chart for her consent to telehealth for [facility].  Video Connection Lost Video connection was lost at > 50% of the duration of this visit, at which time the remainder of the visit was completed via audio only.     Date:  05/04/2020   ID:  Karen Pierce, DOB 12-03-1950, MRN [id number]  Patient Location: Home Provider Location: Home Office  PCP:  [name], MD  Cardiologist:  No primary care provider on file.  Electrophysiologist:  None   Evaluation Performed:  Follow-Up Visit  Chief Complaint:  Follow up- Renal artery stenosis  History of Present Illness:    70 yo female with history of hyperlipidemia and renal artery stenosis with normal BP and normal renal function here today for PV follow up. She has been followed in the past by Dr. Gwenlyn Found. Most recent renal artery dopplers in January 2022 with non-obstructive plaque in the right renal artery with normal velocities suggesting no obstructive renal artery stenosis. Her ABIs were normal on the right and left in December 2013. She has had a normal stress Myoview in September 2014.   The patient denies chest pain, dyspnea, palpitations, dizziness, near syncope or syncope. No lower extremity edema.   The patient does not have symptoms concerning for COVID-19 infection (fever, chills, cough, or new shortness of breath).    Past  Medical History:  Diagnosis Date  . Anxiety and depression 11/16/2012  . Breast cyst    dense  . Chicken pox as a kid  . Colon polyp   . GERD (gastroesophageal reflux disease)   . Hiatal hernia   . History of colonic polyps 01/15/2015  . History of shingles 10/04/2012  . Hyperlipidemia   . Kidney stone 10/04/2012  . Other and unspecified hyperlipidemia 10/04/2012  . Overweight 11/29/2016  . Preventative health care 10/04/2012  . Renal artery stenosis (HCC)    kidney stone   Past Surgical History:  Procedure Laterality Date  . cyst removed off left side  1968   benign, skin  . POLPYPECTOMY  08/2004     Current Meds  Medication Sig  . aspirin 81 MG EC tablet Take 81 mg by mouth daily.  Marland Kitchen atorvastatin (LIPITOR) 10 MG tablet TAKE 1 TABLET BY MOUTH DAILY  . calcium carbonate (TUMS - DOSED IN MG ELEMENTAL CALCIUM) 500 MG chewable tablet Chew 1 tablet by mouth daily.  . Cholecalciferol (VITAMIN D3) 5000 UNITS CAPS Take 1 capsule by mouth daily.  Astrid Drafts CAPS Take 1 capsule by mouth daily.   . Multiple Vitamins-Minerals (MULTIVITAMIN WITH MINERALS) tablet Take 1 tablet by mouth daily.  . Probiotic Product (PROBIOTIC DAILY PO) Take 1 capsule by mouth daily.      Allergies:   Patient has no known allergies.   Social History   Tobacco Use  . Smoking status: Former Smoker    Packs/day: 0.50  Years: 10.00    Pack years: 5.00    Types: Cigarettes    Start date: 04/18/1988  . Smokeless tobacco: Never Used  Vaping Use  . Vaping Use: Never used  Substance Use Topics  . Alcohol use: Yes    Comment: occasionally  . Drug use: No     Family Hx: The patient's family history includes Arthritis in her father, mother, sister, and sister; Cancer in her brother; Cancer (age of onset: 24) in her mother; Heart attack in her maternal grandfather; Heart disease in her father, maternal grandfather, maternal grandmother, mother, paternal grandfather, and paternal grandmother; Hyperlipidemia in  her mother; Hypertension in her father; Macular degeneration in her father; Pneumonia in her sister; Ulcers in her father and paternal grandfather.  ROS:   Please see the history of present illness.    All other systems reviewed and are negative.   Prior CV studies:   The following studies were reviewed today:    Labs/Other Tests and Data Reviewed:    EKG:  No ECG reviewed.  Recent Labs: No results found for requested labs within last 8760 hours.   Recent Lipid Panel Lab Results  Component Value Date/Time   CHOL 183 12/04/2017 09:22 AM   TRIG 58.0 12/04/2017 09:22 AM   HDL 83.50 12/04/2017 09:22 AM   CHOLHDL 2 12/04/2017 09:22 AM   LDLCALC 88 12/04/2017 09:22 AM    Wt Readings from Last 3 Encounters:  05/04/20 160 lb (72.6 kg)  03/30/18 166 lb 8 oz (75.5 kg)  01/26/18 166 lb 6.4 oz (75.5 kg)     Objective:    Vital Signs:  BP (!) 155/82   Pulse 69   Ht 5\' 7"  (1.702 m)   Wt 160 lb (72.6 kg)   LMP 04/19/2007   BMI 25.06 kg/m      ASSESSMENT & PLAN:     1. Renal artery stenosis: Renal dopplers in January 2022 with no obstructive disease in either renal artery. There is mild plaque noted in the right renal artery.  Repeat renal artery dopplers in January 2024.   2. HLD: Lipids followed in primary care. Continue statin  COVID-19 Education: The signs and symptoms of COVID-19 were discussed with the patient and how to seek care for testing (follow up with PCP or arrange E-visit).  The importance of social distancing was discussed today.  Time:   Today, I have spent 12 minutes with the patient with telehealth technology discussing the above problems.     Medication Adjustments/Labs and Tests Ordered: Current medicines are reviewed at length with the patient today.  Concerns regarding medicines are outlined above.   Tests Ordered: No orders of the defined types were placed in this encounter.   Medication Changes: No orders of the defined types were placed  in this encounter.   Disposition:  Follow up in 2 year(s)  Signed, Lauree Chandler, MD  05/04/2020 10:13 AM    [place]

## 2020-05-04 ENCOUNTER — Other Ambulatory Visit: Payer: Self-pay

## 2020-05-04 ENCOUNTER — Telehealth (INDEPENDENT_AMBULATORY_CARE_PROVIDER_SITE_OTHER): Payer: Medicare Other | Admitting: Cardiovascular Disease

## 2020-05-04 VITALS — BP 155/82 | HR 69 | Ht 67.0 in | Wt 160.0 lb

## 2020-05-04 DIAGNOSIS — I701 Atherosclerosis of renal artery: Secondary | ICD-10-CM | POA: Diagnosis not present

## 2020-05-04 NOTE — Patient Instructions (Signed)
Medication Instructions:  No changes *If you need a refill on your cardiac medications before your next appointment, please call your pharmacy*   Lab Work: none If you have labs (blood work) drawn today and your tests are completely normal, you will receive your results only by: Marland Kitchen MyChart Message (if you have MyChart) OR . A paper copy in the mail If you have any lab test that is abnormal or we need to change your treatment, we will call you to review the results.   Testing/Procedures: Will plan for renal artery doppler (ultrasound) in 2 years.    Follow-Up: At [facility], you and your health needs are our priority.  As part of our continuing mission to provide you with exceptional heart care, we have created designated Provider Care Teams.  These Care Teams include your primary Cardiologist (physician) and Advanced Practice Providers (APPs -  Physician Assistants and Nurse Practitioners) who all work together to provide you with the care you need, when you need it.  Your next appointment:   2 year(s)  The format for your next appointment:   In Person  Provider:   Lauree Chandler, MD   Other Instructions Renal artery ultrasound in 2 years, prior to next cardiology follow up.

## 2020-06-17 DIAGNOSIS — H02831 Dermatochalasis of right upper eyelid: Secondary | ICD-10-CM | POA: Diagnosis not present

## 2020-06-17 DIAGNOSIS — H0102A Squamous blepharitis right eye, upper and lower eyelids: Secondary | ICD-10-CM | POA: Diagnosis not present

## 2020-06-17 DIAGNOSIS — H02834 Dermatochalasis of left upper eyelid: Secondary | ICD-10-CM | POA: Diagnosis not present

## 2020-06-17 DIAGNOSIS — H43811 Vitreous degeneration, right eye: Secondary | ICD-10-CM | POA: Diagnosis not present

## 2020-06-17 DIAGNOSIS — H0102B Squamous blepharitis left eye, upper and lower eyelids: Secondary | ICD-10-CM | POA: Diagnosis not present

## 2020-06-17 DIAGNOSIS — H2513 Age-related nuclear cataract, bilateral: Secondary | ICD-10-CM | POA: Diagnosis not present

## 2020-06-17 DIAGNOSIS — H04123 Dry eye syndrome of bilateral lacrimal glands: Secondary | ICD-10-CM | POA: Diagnosis not present

## 2020-06-17 DIAGNOSIS — Z83518 Family history of other specified eye disorder: Secondary | ICD-10-CM | POA: Diagnosis not present

## 2020-11-18 ENCOUNTER — Other Ambulatory Visit (HOSPITAL_BASED_OUTPATIENT_CLINIC_OR_DEPARTMENT_OTHER): Payer: Self-pay | Admitting: Family Medicine

## 2020-11-18 DIAGNOSIS — Z1231 Encounter for screening mammogram for malignant neoplasm of breast: Secondary | ICD-10-CM

## 2021-01-22 DIAGNOSIS — U071 COVID-19: Secondary | ICD-10-CM | POA: Diagnosis not present

## 2021-03-15 ENCOUNTER — Ambulatory Visit (HOSPITAL_BASED_OUTPATIENT_CLINIC_OR_DEPARTMENT_OTHER)
Admission: RE | Admit: 2021-03-15 | Discharge: 2021-03-15 | Disposition: A | Discharge status: Home / Self Care | Payer: Medicare Other | Source: Ambulatory Visit | Attending: Family Medicine | Admitting: Family Medicine

## 2021-03-15 ENCOUNTER — Other Ambulatory Visit: Payer: Self-pay

## 2021-03-15 DIAGNOSIS — Z1231 Encounter for screening mammogram for malignant neoplasm of breast: Secondary | ICD-10-CM | POA: Insufficient documentation

## 2021-03-30 DIAGNOSIS — Z23 Encounter for immunization: Secondary | ICD-10-CM | POA: Diagnosis not present

## 2021-05-17 ENCOUNTER — Encounter: Payer: Self-pay | Admitting: Family Medicine

## 2021-05-17 ENCOUNTER — Ambulatory Visit (INDEPENDENT_AMBULATORY_CARE_PROVIDER_SITE_OTHER): Payer: Medicare Other | Admitting: Family Medicine

## 2021-05-17 VITALS — BP 114/66 | HR 75 | Temp 98.2°F | Resp 16 | Ht 67.0 in | Wt 171.4 lb

## 2021-05-17 DIAGNOSIS — E663 Overweight: Secondary | ICD-10-CM

## 2021-05-17 DIAGNOSIS — R059 Cough, unspecified: Secondary | ICD-10-CM | POA: Diagnosis not present

## 2021-05-17 DIAGNOSIS — K219 Gastro-esophageal reflux disease without esophagitis: Secondary | ICD-10-CM

## 2021-05-17 DIAGNOSIS — I701 Atherosclerosis of renal artery: Secondary | ICD-10-CM | POA: Diagnosis not present

## 2021-05-17 DIAGNOSIS — E782 Mixed hyperlipidemia: Secondary | ICD-10-CM | POA: Diagnosis not present

## 2021-05-17 DIAGNOSIS — Z0000 Encounter for general adult medical examination without abnormal findings: Secondary | ICD-10-CM

## 2021-05-17 DIAGNOSIS — R600 Localized edema: Secondary | ICD-10-CM

## 2021-05-17 DIAGNOSIS — Z124 Encounter for screening for malignant neoplasm of cervix: Secondary | ICD-10-CM

## 2021-05-17 NOTE — Assessment & Plan Note (Signed)
Avoid offending foods, start probiotics. Do not eat large meals in late evening and consider raising head of bed.  

## 2021-05-17 NOTE — Assessment & Plan Note (Signed)
Patient encouraged to maintain heart healthy diet, regular exercise, adequate sleep. Consider daily probiotics. Take medications as prescribed. Will return in am for fasting labs, agrees to Pap with next CPE, repeat Dexa scan this year. MGM due in coming year but is utd. Prevnar 20, Shingrix and COVID booster are recommended

## 2021-05-17 NOTE — Assessment & Plan Note (Signed)
Elevate feet above heart for 15 minutes several times a day, minimize sodium, stay active and use knee highs light weight compression stocking daily on in am off in pm

## 2021-05-17 NOTE — Assessment & Plan Note (Signed)
Agrees to pap with next check up

## 2021-05-17 NOTE — Patient Instructions (Addendum)
Shingrix is the new shingles shot, 2 shots over 2-6 months, confirm coverage with insurance and document, then can return here for shots with nurse appt or at pharmacy  Would get Prevnar 20 one shot only  Space all shots by 2 weeks   Tdap can be given after injury or any time but has to be cash pay  Consider light weight compression socks, knee high and elevate feet above heart x 15 minutes twice a day, minimize sodium  Premarin cream twice a week for sexual dysfunction   Preventive Care 39 Years and Older, Female Preventive care refers to lifestyle choices and visits with your health care provider that can promote health and wellness. Preventive care visits are also called wellness exams. What can I expect for my preventive care visit? Counseling Your health care provider may ask you questions about your: Medical history, including: Past medical problems. Family medical history. Pregnancy and menstrual history. History of falls. Current health, including: Memory and ability to understand (cognition). Emotional well-being. Home life and relationship well-being. Sexual activity and sexual health. Lifestyle, including: Alcohol, nicotine or tobacco, and drug use. Access to firearms. Diet, exercise, and sleep habits. Work and work Statistician. Sunscreen use. Safety issues such as seatbelt and bike helmet use. Physical exam Your health care provider will check your: Height and weight. These may be used to calculate your BMI (body mass index). BMI is a measurement that tells if you are at a healthy weight. Waist circumference. This measures the distance around your waistline. This measurement also tells if you are at a healthy weight and may help predict your risk of certain diseases, such as type 2 diabetes and high blood pressure. Heart rate and blood pressure. Body temperature. Skin for abnormal spots. What immunizations do I need? Vaccines are usually given at various ages,  according to a schedule. Your health care provider will recommend vaccines for you based on your age, medical history, and lifestyle or other factors, such as travel or where you work. What tests do I need? Screening Your health care provider may recommend screening tests for certain conditions. This may include: Lipid and cholesterol levels. Hepatitis C test. Hepatitis B test. HIV (human immunodeficiency virus) test. STI (sexually transmitted infection) testing, if you are at risk. Lung cancer screening. Colorectal cancer screening. Diabetes screening. This is done by checking your blood sugar (glucose) after you have not eaten for a while (fasting). Mammogram. Talk with your health care provider about how often you should have regular mammograms. BRCA-related cancer screening. This may be done if you have a family history of breast, ovarian, tubal, or peritoneal cancers. Bone density scan. This is done to screen for osteoporosis. Talk with your health care provider about your test results, treatment options, and if necessary, the need for more tests. Follow these instructions at home: Eating and drinking  Eat a diet that includes fresh fruits and vegetables, whole grains, lean protein, and low-fat dairy products. Limit your intake of foods with high amounts of sugar, saturated fats, and salt. Take vitamin and mineral supplements as recommended by your health care provider. Do not drink alcohol if your health care provider tells you not to drink. If you drink alcohol: Limit how much you have to 0-1 drink a day. Know how much alcohol is in your drink. In the [place] one drink equals one 12 oz bottle of beer (355 mL), one 5 oz glass of wine (148 mL), or one 1 oz glass of hard liquor (44  mL). Lifestyle Brush your teeth every morning and night with fluoride toothpaste. Floss one time each day. Exercise for at least 30 minutes 5 or more days each week. Do not use any products that contain  nicotine or tobacco. These products include cigarettes, chewing tobacco, and vaping devices, such as e-cigarettes. If you need help quitting, ask your health care provider. Do not use drugs. If you are sexually active, practice safe sex. Use a condom or other form of protection in order to prevent STIs. Take aspirin only as told by your health care provider. Make sure that you understand how much to take and what form to take. Work with your health care provider to find out whether it is safe and beneficial for you to take aspirin daily. Ask your health care provider if you need to take a cholesterol-lowering medicine (statin). Find healthy ways to manage stress, such as: Meditation, yoga, or listening to music. Journaling. Talking to a trusted person. Spending time with friends and family. Minimize exposure to UV radiation to reduce your risk of skin cancer. Safety Always wear your seat belt while driving or riding in a vehicle. Do not drive: If you have been drinking alcohol. Do not ride with someone who has been drinking. When you are tired or distracted. While texting. If you have been using any mind-altering substances or drugs. Wear a helmet and other protective equipment during sports activities. If you have firearms in your house, make sure you follow all gun safety procedures. What's next? Visit your health care provider once a year for an annual wellness visit. Ask your health care provider how often you should have your eyes and teeth checked. Stay up to date on all vaccines. This information is not intended to replace advice given to you by your health care provider. Make sure you discuss any questions you have with your health care provider. Document Revised: 09/30/2020 Document Reviewed: 09/30/2020 Elsevier Patient Education  [place].

## 2021-05-17 NOTE — Assessment & Plan Note (Signed)
Mild, intermittent and non productive. Consider allergies, silent reflux, notes it has been around off and on since covid last year. Possible cough variant asthma. She will let us know if she is ready to have it worked up further, reminded to hydrate well

## 2021-05-17 NOTE — Assessment & Plan Note (Signed)
Encourage heart healthy diet such as MIND or DASH diet, increase exercise, avoid trans fats, simple carbohydrates and processed foods, consider a krill or fish or flaxseed oil cap daily.  °

## 2021-05-17 NOTE — Progress Notes (Signed)
Subjective:   By signing my name below, I, Karen Pierce, attest that this documentation has been prepared under the direction and in the presence of Karen Lukes, MD. 05/17/2021      Patient ID: Karen Pierce, female    DOB: November 01, 1950, 71 y.o.   MRN: [id number]  Chief Complaint  Patient presents with   Annual Exam    HPI Patient is in today for a comprehensive physical exam and follow up on chronic medical concerns. No recent illnesses or ED visits.   She reports she has has a consistent cough since she had Covid-19 in January 2022. Adds that there has been a weird "full" feeling in her right ear for the past week. The feeling is not painful but described as uncomfortable. Does not think it is serious yet.  She also complains of swelling in the left leg that is not exacerbated by any activity. She has been using OTC creams that help to resolve the pain. No recent falls or injury. She adds that she is unsure if she sprained the ankle while she was younger.   She is UTD on vision care. She has a family history of macular degeneration and is keeping a check on it.  She is asking for medication to help increase her libido and reduce vaginal dryness.   She denies fever, congestion, eye pain, chest pain, palpitations, shortness of breath, nausea, abdominal pain, diarrhea and blood in stool. Also denies dysuria, frequency, back pain and headaches.    She has 3 Covid-19 vaccines at this time. She is not UTD on the pneumonia and shingles vaccine.     No changes in family medical history.                                                    Past Medical History:  Diagnosis Date   Anxiety and depression 11/16/2012   Breast cyst    dense   Chicken pox as a kid   Colon polyp    GERD (gastroesophageal reflux disease)    Hiatal hernia    History of colonic polyps 01/15/2015   History of shingles 10/04/2012   Hyperlipidemia    Kidney stone 10/04/2012   Other and unspecified hyperlipidemia  10/04/2012   Overweight 11/29/2016   Preventative health care 10/04/2012   Renal artery stenosis (HCC)    kidney stone    Past Surgical History:  Procedure Laterality Date   cyst removed off left side  1968   benign, skin   POLPYPECTOMY  08/2004    Family History  Problem Relation Age of Onset   Arthritis Mother    Heart disease Mother    Cancer Mother 54       breast, sarcoma x 3 bouts in past, under righ arm   Hyperlipidemia Mother    Ulcers Father    Hypertension Father    Arthritis Father    Heart disease Father        CHF   Macular degeneration Father    Heart disease Maternal Grandmother    Heart disease Paternal Grandmother    Heart disease Paternal Grandfather    Ulcers Paternal Grandfather    Cancer Brother        pancreatic   Heart disease Maternal Grandfather    Heart attack Maternal Grandfather  Pneumonia Sister    Arthritis Sister        rheumatoid   Arthritis Sister        s/p TKR    Social History   Socioeconomic History   Marital status: Married    Spouse name: Not on file   Number of children: Not on file   Years of education: Not on file   Highest education level: Not on file  Occupational History   Not on file  Tobacco Use   Smoking status: Former    Packs/day: 0.50    Years: 10.00    Pack years: 5.00    Types: Cigarettes    Start date: 04/18/1988   Smokeless tobacco: Never  Vaping Use   Vaping Use: Never used  Substance and Sexual Activity   Alcohol use: Yes    Comment: occasionally   Drug use: No   Sexual activity: Yes    Partners: Male  Other Topics Concern   Not on file  Social History Narrative   Not on file   Social Determinants of Health   Financial Resource Strain: Not on file  Food Insecurity: Not on file  Transportation Needs: Not on file  Physical Activity: Not on file  Stress: Not on file  Social Connections: Not on file  Intimate Partner Violence: Not on file    Outpatient Medications Prior to Visit   Medication Sig Dispense Refill   aspirin 81 MG EC tablet Take 81 mg by mouth daily.     atorvastatin (LIPITOR) 10 MG tablet TAKE 1 TABLET BY MOUTH DAILY 30 tablet 11   calcium carbonate (TUMS - DOSED IN MG ELEMENTAL CALCIUM) 500 MG chewable tablet Chew 1 tablet by mouth daily.     Cholecalciferol (VITAMIN D3) 5000 UNITS CAPS Take 1 capsule by mouth daily.     Krill Oil CAPS Take 1 capsule by mouth daily.      Multiple Vitamins-Minerals (MULTIVITAMIN WITH MINERALS) tablet Take 1 tablet by mouth daily.     Probiotic Product (PROBIOTIC DAILY PO) Take 1 capsule by mouth daily.      No facility-administered medications prior to visit.    No Known Allergies  Review of Systems  Constitutional:  Negative for fever.  HENT:  Negative for congestion.        (+) ear fullness  Eyes:  Negative for pain.  Respiratory:  Positive for cough. Negative for shortness of breath.   Cardiovascular:  Positive for leg swelling (left leg). Negative for chest pain and palpitations.  Gastrointestinal:  Negative for abdominal pain, blood in stool, diarrhea and nausea.  Genitourinary:  Negative for dysuria and frequency.  Musculoskeletal:  Negative for back pain.       (+) left leg pain  Neurological:  Negative for headaches.      Objective:    Physical Exam Constitutional:      General: She is not in acute distress.    Appearance: Normal appearance. She is well-developed. She is not ill-appearing.  HENT:     Head: Normocephalic and atraumatic.     Right Ear: Tympanic membrane, ear canal and external ear normal.     Left Ear: Tympanic membrane, ear canal and external ear normal.  Eyes:     Extraocular Movements: Extraocular movements intact.     Right eye: No nystagmus.     Left eye: No nystagmus.     Conjunctiva/sclera: Conjunctivae normal.     Pupils: Pupils are equal, round, and reactive to light.  Neck:  Thyroid: No thyromegaly.  Cardiovascular:     Rate and Rhythm: Normal rate and regular  rhythm.     Pulses: Normal pulses.     Heart sounds: Normal heart sounds. No murmur heard.   No gallop.  Pulmonary:     Effort: Pulmonary effort is normal. No respiratory distress.     Breath sounds: Normal breath sounds. No wheezing, rhonchi or rales.  Abdominal:     General: Bowel sounds are normal. There is no distension.     Palpations: Abdomen is soft. There is no mass.     Tenderness: There is no abdominal tenderness. There is no guarding or rebound.     Hernia: No hernia is present.  Musculoskeletal:     Cervical back: Normal range of motion and neck supple.     Comments: 5/5 strength in upper and lower extremities   Lymphadenopathy:     Cervical: No cervical adenopathy.  Skin:    General: Skin is warm and dry.  Neurological:     Mental Status: She is alert and oriented to person, place, and time.     Deep Tendon Reflexes:     Reflex Scores:      Patellar reflexes are 2+ on the right side and 2+ on the left side. Psychiatric:        Behavior: Behavior normal.    BP 114/66    Pulse 75    Temp 98.2 F (36.8 C)    Resp 16    Ht 5\' 7"  (1.702 m)    Wt 171 lb 6.4 oz (77.7 kg)    LMP 04/19/2007    SpO2 96%    BMI 26.85 kg/m  Wt Readings from Last 3 Encounters:  05/17/21 171 lb 6.4 oz (77.7 kg)  05/04/20 160 lb (72.6 kg)  03/30/18 166 lb 8 oz (75.5 kg)    Diabetic Foot Exam - Simple   No data filed    Lab Results  Component Value Date   WBC 5.2 12/04/2017   HGB 14.6 12/04/2017   HCT 43.7 12/04/2017   PLT 308.0 12/04/2017   GLUCOSE 98 12/04/2017   CHOL 183 12/04/2017   TRIG 58.0 12/04/2017   HDL 83.50 12/04/2017   LDLCALC 88 12/04/2017   ALT 15 12/04/2017   AST 16 12/04/2017   NA 140 12/04/2017   K 4.2 12/04/2017   CL 103 12/04/2017   CREATININE 0.83 12/04/2017   BUN 23 12/04/2017   CO2 31 12/04/2017   TSH 2.20 12/04/2017   HGBA1C 5.3 11/07/2012    Lab Results  Component Value Date   TSH 2.20 12/04/2017   Lab Results  Component Value Date   WBC 5.2  12/04/2017   HGB 14.6 12/04/2017   HCT 43.7 12/04/2017   MCV 92.7 12/04/2017   PLT 308.0 12/04/2017   Lab Results  Component Value Date   NA 140 12/04/2017   K 4.2 12/04/2017   CO2 31 12/04/2017   GLUCOSE 98 12/04/2017   BUN 23 12/04/2017   CREATININE 0.83 12/04/2017   BILITOT 0.8 12/04/2017   ALKPHOS 59 12/04/2017   AST 16 12/04/2017   ALT 15 12/04/2017   PROT 6.5 12/04/2017   ALBUMIN 4.4 12/04/2017   CALCIUM 9.7 12/04/2017   GFR 73.34 12/04/2017   Lab Results  Component Value Date   CHOL 183 12/04/2017   Lab Results  Component Value Date   HDL 83.50 12/04/2017   Lab Results  Component Value Date   LDLCALC 88 12/04/2017  Lab Results  Component Value Date   TRIG 58.0 12/04/2017   Lab Results  Component Value Date   CHOLHDL 2 12/04/2017   Lab Results  Component Value Date   HGBA1C 5.3 11/07/2012        Colonoscopy- Last completed on 08/28/2015. There were internal hemorrhoids otherwise exam was normal. Repeat in 10 years. Mammogram- Last checked on 03/15/2021. Results were normal. Repeat in 1 year Pap Smear- Last checked on 11/29/2016. Results were normal. Repeat in 3-5 years Dexa- Last checked on 01/01/2018. Patient was osteopenic according to the [facility]. Repeat in 2 years.  Assessment & Plan:   Problem List Items Addressed This Visit     RENAL ARTERY STENOSIS   Relevant Orders   Comprehensive metabolic panel   TSH   GERD (gastroesophageal reflux disease)    Avoid offending foods, start probiotics. Do not eat large meals in late evening and consider raising head of bed.       Relevant Orders   TSH   Preventative health care    Patient encouraged to maintain heart healthy diet, regular exercise, adequate sleep. Consider daily probiotics. Take medications as prescribed. Will return in am for fasting labs, agrees to Pap with next CPE, repeat Dexa scan this year. MGM due in coming year but is utd. Prevnar 20, Shingrix and COVID  booster are recommended      Relevant Orders   TSH   Hyperlipidemia, mixed - Primary    Encourage heart healthy diet such as MIND or DASH diet, increase exercise, avoid trans fats, simple carbohydrates and processed foods, consider a krill or fish or flaxseed oil cap daily.       Relevant Orders   Lipid panel   TSH   Cervical cancer screening    Agrees to pap with next check up      Pedal edema    Elevate feet above heart for 15 minutes several times a day, minimize sodium, stay active and use knee highs light weight compression stocking daily on in am off in pm      Overweight    Encouraged DASH or MIND diet, decrease po intake and increase exercise as tolerated. Needs 7-8 hours of sleep nightly. Avoid trans fats, eat small, frequent meals every 4-5 hours with lean proteins, complex carbs and healthy fats. Minimize simple carbs, high fat foods and processed foods      Cough    Mild, intermittent and non productive. Consider allergies, silent reflux, notes it has been around off and on since covid last year. Possible cough variant asthma. She will let us know if she is ready to have it worked up further, reminded to hydrate well      Relevant Orders   CBC   TSH    No orders of the defined types were placed in this encounter.   I,Karen Pierce,acting as a Education administrator for Penni Homans, MD.,have documented all relevant documentation on the behalf of Penni Homans, MD,as directed by  Penni Homans, MD while in the presence of Penni Homans, MD.   I, Karen Lukes, MD. , personally preformed the services described in this documentation.  All medical record entries made by the scribe were at my direction and in my presence.  I have reviewed the chart and discharge instructions (if applicable) and agree that the record reflects my personal performance and is accurate and complete. 05/17/2021

## 2021-05-17 NOTE — Assessment & Plan Note (Signed)
Encouraged DASH or MIND diet, decrease po intake and increase exercise as tolerated. Needs 7-8 hours of sleep nightly. Avoid trans fats, eat small, frequent meals every 4-5 hours with lean proteins, complex carbs and healthy fats. Minimize simple carbs, high fat foods and processed foods 

## 2021-05-18 ENCOUNTER — Other Ambulatory Visit (INDEPENDENT_AMBULATORY_CARE_PROVIDER_SITE_OTHER): Payer: Medicare Other

## 2021-05-18 DIAGNOSIS — R059 Cough, unspecified: Secondary | ICD-10-CM

## 2021-05-18 DIAGNOSIS — K219 Gastro-esophageal reflux disease without esophagitis: Secondary | ICD-10-CM | POA: Diagnosis not present

## 2021-05-18 DIAGNOSIS — Z0000 Encounter for general adult medical examination without abnormal findings: Secondary | ICD-10-CM

## 2021-05-18 DIAGNOSIS — E782 Mixed hyperlipidemia: Secondary | ICD-10-CM

## 2021-05-18 DIAGNOSIS — I701 Atherosclerosis of renal artery: Secondary | ICD-10-CM | POA: Diagnosis not present

## 2021-05-18 LAB — COMPREHENSIVE METABOLIC PANEL
ALT: 14 U/L (ref 0–35)
AST: 16 U/L (ref 0–37)
Albumin: 4.5 g/dL (ref 3.5–5.2)
Alkaline Phosphatase: 56 U/L (ref 39–117)
BUN: 20 mg/dL (ref 6–23)
CO2: 33 mEq/L — ABNORMAL HIGH (ref 19–32)
Calcium: 9.7 mg/dL (ref 8.4–10.5)
Chloride: 104 mEq/L (ref 96–112)
Creatinine, Ser: 0.68 mg/dL (ref 0.40–1.20)
GFR: 89.48 mL/min (ref >60.00)
Glucose, Bld: 86 mg/dL (ref 70–99)
Potassium: 4.8 mEq/L (ref 3.5–5.1)
Sodium: 141 mEq/L (ref 135–145)
Total Bilirubin: 0.9 mg/dL (ref 0.2–1.2)
Total Protein: 6.5 g/dL (ref 6.0–8.3)

## 2021-05-18 LAB — CBC
HCT: 44.8 % (ref 36.0–46.0)
Hemoglobin: 14.4 g/dL (ref 12.0–15.0)
MCHC: 32.2 g/dL (ref 30.0–36.0)
MCV: 93.8 fl (ref 78.0–100.0)
Platelets: 306 10*3/uL (ref 150.0–400.0)
RBC: 4.78 Mil/uL (ref 3.87–5.11)
RDW: 13.4 % (ref 11.5–15.5)
WBC: 6.2 10*3/uL (ref 4.0–10.5)

## 2021-05-18 LAB — LIPID PANEL
Cholesterol: 244 mg/dL — ABNORMAL HIGH (ref 0–200)
HDL: 82.2 mg/dL (ref >39.00)
LDL Cholesterol: 150 mg/dL — ABNORMAL HIGH (ref 0–99)
NonHDL: 161.65
Total CHOL/HDL Ratio: 3
Triglycerides: 60 mg/dL (ref 0.0–149.0)
VLDL: 12 mg/dL (ref 0.0–40.0)

## 2021-05-18 LAB — TSH: TSH: 1.96 u[IU]/mL (ref 0.35–5.50)

## 2021-05-19 ENCOUNTER — Encounter: Payer: Self-pay | Admitting: Family Medicine

## 2021-05-20 ENCOUNTER — Other Ambulatory Visit: Payer: Self-pay | Admitting: Family Medicine

## 2021-05-20 ENCOUNTER — Other Ambulatory Visit: Payer: Self-pay

## 2021-05-20 DIAGNOSIS — E782 Mixed hyperlipidemia: Secondary | ICD-10-CM

## 2021-05-20 MED ORDER — PREMARIN 0.625 MG/GM VA CREA: TOPICAL_CREAM | VAGINAL | 2 refills | Status: DC

## 2021-05-20 MED ORDER — ATORVASTATIN CALCIUM 10 MG PO TABS: 10.0000 mg | ORAL_TABLET | Freq: Every day | ORAL | 11 refills | Status: DC

## 2021-06-29 DIAGNOSIS — H2513 Age-related nuclear cataract, bilateral: Secondary | ICD-10-CM | POA: Diagnosis not present

## 2021-06-29 DIAGNOSIS — H524 Presbyopia: Secondary | ICD-10-CM | POA: Diagnosis not present

## 2021-06-29 DIAGNOSIS — H0102A Squamous blepharitis right eye, upper and lower eyelids: Secondary | ICD-10-CM | POA: Diagnosis not present

## 2021-06-29 DIAGNOSIS — H04123 Dry eye syndrome of bilateral lacrimal glands: Secondary | ICD-10-CM | POA: Diagnosis not present

## 2021-06-29 DIAGNOSIS — H43813 Vitreous degeneration, bilateral: Secondary | ICD-10-CM | POA: Diagnosis not present

## 2021-07-16 ENCOUNTER — Ambulatory Visit (INDEPENDENT_AMBULATORY_CARE_PROVIDER_SITE_OTHER): Payer: Medicare Other

## 2021-07-16 VITALS — Ht 67.0 in | Wt 170.0 lb

## 2021-07-16 DIAGNOSIS — Z0000 Encounter for general adult medical examination without abnormal findings: Secondary | ICD-10-CM

## 2021-07-16 NOTE — Progress Notes (Signed)
?I connected with Daneshia Tavano today by telephone and verified that I am speaking with the correct person using two identifiers. ?Location patient: home ?Location provider: work ?Persons participating in the virtual visit: Walsie, Smeltz LPN. ?  ?I discussed the limitations, risks, security and privacy concerns of performing an evaluation and management service by telephone and the availability of in person appointments. I also discussed with the patient that there may be a patient responsible charge related to this service. The patient expressed understanding and verbally consented to this telephonic visit.  ?  ?Interactive audio and video telecommunications were attempted between this provider and patient, however failed, due to patient having technical difficulties OR patient did not have access to video capability.  We continued and completed visit with audio only. ? ?  ? ?Vital signs may be patient reported or missing. ? ?Subjective:  ? Karen Pierce is a 71 y.o. female who presents for an Initial Medicare Annual Wellness Visit. ? ?Review of Systems    ? ?Cardiac Risk Factors include: advanced age (>55mn, >>23women);dyslipidemia ? ?   ?Objective:  ?  ?Today's Vitals  ? 07/16/21 1141  ?Weight: 170 lb (77.1 kg)  ?Height: '5\' 7"'$  (1.702 m)  ? ?Body mass index is 26.63 kg/m?. ? ? ?  07/16/2021  ? 11:45 AM 01/15/2015  ?  9:04 AM  ?Advanced Directives  ?Does Patient Have a Medical Advance Directive? Yes Yes  ?Type of [place] will  ?Copy of [place] Chart? No - copy requested   ? ? ?Current Medications (verified) ?Outpatient Encounter Medications as of 07/16/2021  ?Medication Sig  ? aspirin 81 MG EC tablet Take 81 mg by mouth daily.  ? atorvastatin (LIPITOR) 10 MG tablet Take 1 tablet (10 mg total) by mouth daily.  ? calcium carbonate (TUMS - DOSED IN MG ELEMENTAL CALCIUM) 500 MG chewable tablet  Chew 1 tablet by mouth daily.  ? Cholecalciferol (VITAMIN D3) 5000 UNITS CAPS Take 1 capsule by mouth daily.  ? conjugated estrogens (PREMARIN) vaginal cream Apply small amount to vaginal mucosa twice weekly  ? Krill Oil CAPS Take 1 capsule by mouth daily.   ? Multiple Vitamins-Minerals (MULTIVITAMIN WITH MINERALS) tablet Take 1 tablet by mouth daily.  ? Probiotic Product (PROBIOTIC DAILY PO) Take 1 capsule by mouth daily.   ? ?No facility-administered encounter medications on file as of 07/16/2021.  ? ? ?Allergies (verified) ?Patient has no known allergies.  ? ?History: ?Past Medical History:  ?Diagnosis Date  ? Anxiety and depression 11/16/2012  ? Breast cyst   ? dense  ? Chicken pox as a kid  ? Colon polyp   ? GERD (gastroesophageal reflux disease)   ? Hiatal hernia   ? History of colonic polyps 01/15/2015  ? History of shingles 10/04/2012  ? Hyperlipidemia   ? Kidney stone 10/04/2012  ? Other and unspecified hyperlipidemia 10/04/2012  ? Overweight 11/29/2016  ? Preventative health care 10/04/2012  ? Renal artery stenosis ([place]   ? kidney stone  ? ?Past Surgical History:  ?Procedure Laterality Date  ? cyst removed off left side  1968  ? benign, skin  ? POLPYPECTOMY  08/2004  ? ?Family History  ?Problem Relation Age of Onset  ? Arthritis Mother   ? Heart disease Mother   ? Cancer Mother 746 ?     breast, sarcoma x 3 bouts in past, under righ arm  ?  Hyperlipidemia Mother   ? Ulcers Father   ? Hypertension Father   ? Arthritis Father   ? Heart disease Father   ?     CHF  ? Macular degeneration Father   ? Heart disease Maternal Grandmother   ? Heart disease Paternal Grandmother   ? Heart disease Paternal Grandfather   ? Ulcers Paternal Grandfather   ? Cancer Brother   ?     pancreatic  ? Heart disease Maternal Grandfather   ? Heart attack Maternal Grandfather   ? Pneumonia Sister   ? Arthritis Sister   ?     rheumatoid  ? Arthritis Sister   ?     s/p TKR  ? ?Social History  ? ?Socioeconomic History  ? Marital status: Married   ?  Spouse name: Not on file  ? Number of children: Not on file  ? Years of education: Not on file  ? Highest education level: Not on file  ?Occupational History  ? Not on file  ?Tobacco Use  ? Smoking status: Former  ?  Packs/day: 0.50  ?  Years: 10.00  ?  Pack years: 5.00  ?  Types: Cigarettes  ?  Start date: 04/18/1988  ? Smokeless tobacco: Never  ?Vaping Use  ? Vaping Use: Never used  ?Substance and Sexual Activity  ? Alcohol use: Yes  ?  Comment: occasionally  ? Drug use: No  ? Sexual activity: Yes  ?  Partners: Male  ?Other Topics Concern  ? Not on file  ?Social History Narrative  ? Not on file  ? ?Social Determinants of Health  ? ?Financial Resource Strain: Low Risk   ? Difficulty of Paying Living Expenses: Not hard at all  ?Food Insecurity: No Food Insecurity  ? Worried About Charity fundraiser in the Last Year: Never true  ? Ran Out of Food in the Last Year: Never true  ?Transportation Needs: No Transportation Needs  ? Lack of Transportation (Medical): No  ? Lack of Transportation (Non-Medical): No  ?Physical Activity: Inactive  ? Days of Exercise per Week: 0 days  ? Minutes of Exercise per Session: 0 min  ?Stress: No Stress Concern Present  ? Feeling of Stress : Not at all  ?Social Connections: Not on file  ? ? ?Tobacco Counseling ?Counseling given: Not Answered ? ? ?Clinical Intake: ? ?Pre-visit preparation completed: Yes ? ?Pain : No/denies pain ? ?  ? ?Nutritional Status: BMI 25 -29 Overweight ?Nutritional Risks: None ?Diabetes: No ? ?How often do you need to have someone help you when you read instructions, pamphlets, or other written materials from your doctor or pharmacy?: 1 - Never ?What is the last grade level you completed in school?: 12th grade ? ?Diabetic? no ? ?Interpreter Needed?: No ? ?Information entered by :: NAllen LPN ? ? ?Activities of Daily Living ? ?  07/16/2021  ? 11:47 AM  ?In your present state of health, do you have any difficulty performing the following activities:  ?Hearing? 0   ?Vision? 0  ?Difficulty concentrating or making decisions? 0  ?Walking or climbing stairs? 0  ?Dressing or bathing? 0  ?Doing errands, shopping? 0  ?Preparing Food and eating ? N  ?Using the Toilet? N  ?In the past six months, have you accidently leaked urine? N  ?Do you have problems with loss of bowel control? N  ?Managing your Medications? N  ?Managing your Finances? N  ?Housekeeping or managing your Housekeeping? N  ? ? ?Patient Care  Team: ?[name], MD as PCP - General (Family Medicine) ?Burnell Blanks, MD as Consulting Physician (Cardiology) ? ?Indicate any recent Medical Services you may have received from other than [facility] providers in the past year (date may be approximate). ? ?   ?Assessment:  ? This is a routine wellness examination for Karen Pierce. ? ?Hearing/Vision screen ?Vision Screening - Comments:: Regular eye exams, Dr. Katy Fitch ? ?Dietary issues and exercise activities discussed: ?Current Exercise Habits: The patient does not participate in regular exercise at present ? ? Goals Addressed   ? ?  ?  ?  ?  ? This Visit's Progress  ?  Patient Stated     ?  07/16/2021, no goals ?  ? ?  ? ?Depression Screen ? ?  07/16/2021  ? 11:46 AM 05/17/2021  ?  3:28 PM 05/17/2021  ?  3:25 PM 12/04/2017  ?  8:25 AM  ?PHQ 2/9 Scores  ?PHQ - 2 Score 0 0 0 0  ?PHQ- 9 Score 0     ?  ?Fall Risk ? ?  07/16/2021  ? 11:46 AM 05/17/2021  ?  3:27 PM 05/17/2021  ?  3:25 PM 12/04/2017  ?  8:25 AM  ?Fall Risk   ?Falls in the past year? 0 0 0 No  ?Number falls in past yr: 0 0 0   ?Injury with Fall? 0 0 0   ?Risk for fall due to : Medication side effect No Fall Risks No Fall Risks   ?Follow up Falls evaluation completed;Education provided;Falls prevention discussed Falls evaluation completed    ? ? ?FALL RISK PREVENTION PERTAINING TO THE HOME: ? ?Any stairs in or around the home? Yes  ?If so, are there any without handrails? No  ?Home free of loose throw rugs in walkways, pet beds, electrical cords, etc? Yes  ?Adequate lighting in  your home to reduce risk of falls? Yes  ? ?ASSISTIVE DEVICES UTILIZED TO PREVENT FALLS: ? ?Life alert? No  ?Use of a cane, walker or w/c? No  ?Grab bars in the bathroom? Yes  ?Shower chair or bench in shower?

## 2021-07-16 NOTE — Patient Instructions (Signed)
Ms. App , ?Thank you for taking time to come for your Medicare Wellness Visit. I appreciate your ongoing commitment to your health goals. Please review the following plan we discussed and let me know if I can assist you in the future.  ? ?Screening recommendations/referrals: ?Colonoscopy: completed 08/28/2015, due 08/27/2025 ?Mammogram: completed 03/15/2021, due 03/16/2022 ?Bone Density: completed 01/01/2018 ?Recommended yearly ophthalmology/optometry visit for glaucoma screening and checkup ?Recommended yearly dental visit for hygiene and checkup ? ?Vaccinations: ?Influenza vaccine: due next flu season ?Pneumococcal vaccine: due ?Tdap vaccine: due ?Shingles vaccine: discussed   ?Covid-19: 03/24/2020, 08/20/2019, 07/25/2019 ? ?Advanced directives: Please bring a copy of your POA (Power of Attorney) and/or Living Will to your next appointment.  ? ?Conditions/risks identified: none ? ?Next appointment: Follow up in one year for your annual wellness visit  ? ? ?Preventive Care 69 Years and Older, Female ?Preventive care refers to lifestyle choices and visits with your health care provider that can promote health and wellness. ?What does preventive care include? ?A yearly physical exam. This is also called an annual well check. ?Dental exams once or twice a year. ?Routine eye exams. Ask your health care provider how often you should have your eyes checked. ?Personal lifestyle choices, including: ?Daily care of your teeth and gums. ?Regular physical activity. ?Eating a healthy diet. ?Avoiding tobacco and drug use. ?Limiting alcohol use. ?Practicing safe sex. ?Taking low-dose aspirin every day. ?Taking vitamin and mineral supplements as recommended by your health care provider. ?What happens during an annual well check? ?The services and screenings done by your health care provider during your annual well check will depend on your age, overall health, lifestyle risk factors, and family history of disease. ?Counseling  ?Your  health care provider may ask you questions about your: ?Alcohol use. ?Tobacco use. ?Drug use. ?Emotional well-being. ?Home and relationship well-being. ?Sexual activity. ?Eating habits. ?History of falls. ?Memory and ability to understand (cognition). ?Work and work Statistician. ?Reproductive health. ?Screening  ?You may have the following tests or measurements: ?Height, weight, and BMI. ?Blood pressure. ?Lipid and cholesterol levels. These may be checked every 5 years, or more frequently if you are over 45 years old. ?Skin check. ?Lung cancer screening. You may have this screening every year starting at age 39 if you have a 30-pack-year history of smoking and currently smoke or have quit within the past 15 years. ?Fecal occult blood test (FOBT) of the stool. You may have this test every year starting at age 22. ?Flexible sigmoidoscopy or colonoscopy. You may have a sigmoidoscopy every 5 years or a colonoscopy every 10 years starting at age 70. ?Hepatitis C blood test. ?Hepatitis B blood test. ?Sexually transmitted disease (STD) testing. ?Diabetes screening. This is done by checking your blood sugar (glucose) after you have not eaten for a while (fasting). You may have this done every 1-3 years. ?Bone density scan. This is done to screen for osteoporosis. You may have this done starting at age 42. ?Mammogram. This may be done every 1-2 years. Talk to your health care provider about how often you should have regular mammograms. ?Talk with your health care provider about your test results, treatment options, and if necessary, the need for more tests. ?Vaccines  ?Your health care provider may recommend certain vaccines, such as: ?Influenza vaccine. This is recommended every year. ?Tetanus, diphtheria, and acellular pertussis (Tdap, Td) vaccine. You may need a Td booster every 10 years. ?Zoster vaccine. You may need this after age 17. ?Pneumococcal 13-valent conjugate (PCV13) vaccine.  One dose is recommended after age  35. ?Pneumococcal polysaccharide (PPSV23) vaccine. One dose is recommended after age 26. ?Talk to your health care provider about which screenings and vaccines you need and how often you need them. ?This information is not intended to replace advice given to you by your health care provider. Make sure you discuss any questions you have with your health care provider. ?Document Released: 05/01/2015 Document Revised: 12/23/2015 Document Reviewed: 02/03/2015 ?Elsevier Interactive Patient Education ? 2017 [place]. ? ?Fall Prevention in the Home ?Falls can cause injuries. They can happen to people of all ages. There are many things you can do to make your home safe and to help prevent falls. ?What can I do on the outside of my home? ?Regularly fix the edges of walkways and driveways and fix any cracks. ?Remove anything that might make you trip as you walk through a door, such as a raised step or threshold. ?Trim any bushes or trees on the path to your home. ?Use bright outdoor lighting. ?Clear any walking paths of anything that might make someone trip, such as rocks or tools. ?Regularly check to see if handrails are loose or broken. Make sure that both sides of any steps have handrails. ?Any raised decks and porches should have guardrails on the edges. ?Have any leaves, snow, or ice cleared regularly. ?Use sand or salt on walking paths during winter. ?Clean up any spills in your garage right away. This includes oil or grease spills. ?What can I do in the bathroom? ?Use night lights. ?Install grab bars by the toilet and in the tub and shower. Do not use towel bars as grab bars. ?Use non-skid mats or decals in the tub or shower. ?If you need to sit down in the shower, use a plastic, non-slip stool. ?Keep the floor dry. Clean up any water that spills on the floor as soon as it happens. ?Remove soap buildup in the tub or shower regularly. ?Attach bath mats securely with double-sided non-slip rug tape. ?Do not have throw  rugs and other things on the floor that can make you trip. ?What can I do in the bedroom? ?Use night lights. ?Make sure that you have a light by your bed that is easy to reach. ?Do not use any sheets or blankets that are too big for your bed. They should not hang down onto the floor. ?Have a firm chair that has side arms. You can use this for support while you get dressed. ?Do not have throw rugs and other things on the floor that can make you trip. ?What can I do in the kitchen? ?Clean up any spills right away. ?Avoid walking on wet floors. ?Keep items that you use a lot in easy-to-reach places. ?If you need to reach something above you, use a strong step stool that has a grab bar. ?Keep electrical cords out of the way. ?Do not use floor polish or wax that makes floors slippery. If you must use wax, use non-skid floor wax. ?Do not have throw rugs and other things on the floor that can make you trip. ?What can I do with my stairs? ?Do not leave any items on the stairs. ?Make sure that there are handrails on both sides of the stairs and use them. Fix handrails that are broken or loose. Make sure that handrails are as long as the stairways. ?Check any carpeting to make sure that it is firmly attached to the stairs. Fix any carpet that is loose or  worn. ?Avoid having throw rugs at the top or bottom of the stairs. If you do have throw rugs, attach them to the floor with carpet tape. ?Make sure that you have a light switch at the top of the stairs and the bottom of the stairs. If you do not have them, ask someone to add them for you. ?What else can I do to help prevent falls? ?Wear shoes that: ?Do not have high heels. ?Have rubber bottoms. ?Are comfortable and fit you well. ?Are closed at the toe. Do not wear sandals. ?If you use a stepladder: ?Make sure that it is fully opened. Do not climb a closed stepladder. ?Make sure that both sides of the stepladder are locked into place. ?Ask someone to hold it for you, if  possible. ?Clearly mark and make sure that you can see: ?Any grab bars or handrails. ?First and last steps. ?Where the edge of each step is. ?Use tools that help you move around (mobility aids) if they are ne

## 2021-10-04 ENCOUNTER — Other Ambulatory Visit: Payer: Medicare Other

## 2021-10-04 ENCOUNTER — Other Ambulatory Visit (INDEPENDENT_AMBULATORY_CARE_PROVIDER_SITE_OTHER): Payer: Medicare Other

## 2021-10-04 ENCOUNTER — Ambulatory Visit: Payer: Medicare Other | Admitting: Family Medicine

## 2021-10-04 DIAGNOSIS — E782 Mixed hyperlipidemia: Secondary | ICD-10-CM | POA: Diagnosis not present

## 2021-10-04 LAB — LIPID PANEL
Cholesterol: 179 mg/dL (ref 0–200)
HDL: 86.4 mg/dL (ref >39.00)
LDL Cholesterol: 81 mg/dL (ref 0–99)
NonHDL: 92.29
Total CHOL/HDL Ratio: 2
Triglycerides: 57 mg/dL (ref 0.0–149.0)
VLDL: 11.4 mg/dL (ref 0.0–40.0)

## 2021-10-06 NOTE — Progress Notes (Signed)
Subjective:    Patient ID: Karen Pierce, female    DOB: [birth year], 71 y.o.   MRN: [id number]  Chief Complaint  Patient presents with   Follow-up   Gynecologic Exam    HPI Patient is in today for a pap smear and follow up on chronic medical concerns. No recent febrile illness or acute hospitalizations. She is trying to stay active and maintain a heart healthy diet. Denies CP/palp/SOB/HA/fevers/GI or GU c/o. Taking meds as prescribed. Has been struggling with a cough for roughly 6-8 weeks. It is productive, it is improving. No fevers or chills.  Past Medical History:  Diagnosis Date   Anxiety and depression 11/16/2012   Breast cyst    dense   Chicken pox as a kid   Colon polyp    GERD (gastroesophageal reflux disease)    Hiatal hernia    History of colonic polyps 01/15/2015   History of shingles 10/04/2012   Hyperlipidemia    Kidney stone 10/04/2012   Other and unspecified hyperlipidemia 10/04/2012   Overweight 11/29/2016   Preventative health care 10/04/2012   Renal artery stenosis (HCC)    kidney stone    Past Surgical History:  Procedure Laterality Date   cyst removed off left side  1968   benign, skin   POLPYPECTOMY  08/2004    Family History  Problem Relation Age of Onset   Arthritis Mother    Heart disease Mother    Cancer Mother 67       breast, sarcoma x 3 bouts in past, under righ arm   Hyperlipidemia Mother    Ulcers Father    Hypertension Father    Arthritis Father    Heart disease Father        CHF   Macular degeneration Father    Heart disease Maternal Grandmother    Heart disease Paternal Grandmother    Heart disease Paternal Grandfather    Ulcers Paternal Grandfather    Cancer Brother        pancreatic   Heart disease Maternal Grandfather    Heart attack Maternal Grandfather    Pneumonia Sister    Arthritis Sister        rheumatoid   Arthritis Sister        s/p TKR    Social History   Socioeconomic History   Marital status: Married     Spouse name: Not on file   Number of children: Not on file   Years of education: Not on file   Highest education level: Not on file  Occupational History   Not on file  Tobacco Use   Smoking status: Former    Packs/day: 0.50    Years: 10.00    Total pack years: 5.00    Types: Cigarettes    Start date: 04/18/1988   Smokeless tobacco: Never  Vaping Use   Vaping Use: Never used  Substance and Sexual Activity   Alcohol use: Yes    Comment: occasionally   Drug use: No   Sexual activity: Yes    Partners: Male  Other Topics Concern   Not on file  Social History Narrative   Not on file   Social Determinants of Health   Financial Resource Strain: Low Risk  (07/16/2021)   Overall Financial Resource Strain (CARDIA)    Difficulty of Paying Living Expenses: Not hard at all  Food Insecurity: No Food Insecurity (07/16/2021)   Hunger Vital Sign    Worried About Charity fundraiser in  the Last Year: Never true    [place] in the Last Year: Never true  Transportation Needs: No Transportation Needs (07/16/2021)   PRAPARE - Hydrologist (Medical): No    Lack of Transportation (Non-Medical): No  Physical Activity: Inactive (07/16/2021)   Exercise Vital Sign    Days of Exercise per Week: 0 days    Minutes of Exercise per Session: 0 min  Stress: No Stress Concern Present (07/16/2021)   [place]    Feeling of Stress : Not at all  Social Connections: Not on file  Intimate Partner Violence: Not on file    Outpatient Medications Prior to Visit  Medication Sig Dispense Refill   aspirin 81 MG EC tablet Take 81 mg by mouth daily.     atorvastatin (LIPITOR) 10 MG tablet Take 1 tablet (10 mg total) by mouth daily. 30 tablet 11   calcium carbonate (TUMS - DOSED IN MG ELEMENTAL CALCIUM) 500 MG chewable tablet Chew 1 tablet by mouth daily.     Cholecalciferol (VITAMIN D3) 5000 UNITS CAPS Take 1  capsule by mouth daily.     Krill Oil CAPS Take 1 capsule by mouth daily.      Multiple Vitamins-Minerals (MULTIVITAMIN WITH MINERALS) tablet Take 1 tablet by mouth daily.     Probiotic Product (PROBIOTIC DAILY PO) Take 1 capsule by mouth daily.      conjugated estrogens (PREMARIN) vaginal cream Apply small amount to vaginal mucosa twice weekly 42.5 g 2   No facility-administered medications prior to visit.    No Known Allergies  Review of Systems  Constitutional:  Negative for fever and malaise/fatigue.  HENT:  Positive for congestion.   Eyes:  Negative for blurred vision.  Respiratory:  Positive for cough. Negative for shortness of breath.   Cardiovascular:  Negative for chest pain, palpitations and leg swelling.  Gastrointestinal:  Negative for abdominal pain, blood in stool and nausea.  Genitourinary:  Negative for dysuria and frequency.  Musculoskeletal:  Negative for falls.  Skin:  Negative for rash.  Neurological:  Negative for dizziness, loss of consciousness and headaches.  Endo/Heme/Allergies:  Negative for environmental allergies.  Psychiatric/Behavioral:  Negative for depression. The patient is not nervous/anxious.        Objective:    Physical Exam Constitutional:      General: She is not in acute distress.    Appearance: She is well-developed.  HENT:     Head: Normocephalic and atraumatic.  Eyes:     Conjunctiva/sclera: Conjunctivae normal.  Neck:     Thyroid: No thyromegaly.  Cardiovascular:     Rate and Rhythm: Normal rate and regular rhythm.     Heart sounds: Normal heart sounds. No murmur heard. Pulmonary:     Effort: Pulmonary effort is normal. No respiratory distress.     Breath sounds: Normal breath sounds.  Abdominal:     General: Bowel sounds are normal. There is no distension.     Palpations: Abdomen is soft. There is no mass.     Tenderness: There is no abdominal tenderness.  Genitourinary:    General: Normal vulva.     Vagina: No vaginal  discharge.     Rectum: Normal.     Comments: No cervical lesion or CMT, no vaginal discharge or lesions.  Musculoskeletal:     Cervical back: Neck supple.  Lymphadenopathy:     Cervical: No cervical adenopathy.  Skin:  General: Skin is warm and dry.  Neurological:     Mental Status: She is alert and oriented to person, place, and time.  Psychiatric:        Behavior: Behavior normal.     BP 138/70 (BP Location: Left Arm, Patient Position: Sitting, Cuff Size: Normal)   Pulse 73   Resp 20   Ht '5\' 7"'$  (1.702 m)   Wt 172 lb (78 kg)   LMP 04/19/2007   SpO2 98%   BMI 26.94 kg/m  Wt Readings from Last 3 Encounters:  10/07/21 172 lb (78 kg)  07/16/21 170 lb (77.1 kg)  05/17/21 171 lb 6.4 oz (77.7 kg)    Diabetic Foot Exam - Simple   No data filed    Lab Results  Component Value Date   WBC 6.2 05/18/2021   HGB 14.4 05/18/2021   HCT 44.8 05/18/2021   PLT 306.0 05/18/2021   GLUCOSE 86 05/18/2021   CHOL 179 10/04/2021   TRIG 57.0 10/04/2021   HDL 86.40 10/04/2021   LDLCALC 81 10/04/2021   ALT 14 05/18/2021   AST 16 05/18/2021   NA 141 05/18/2021   K 4.8 05/18/2021   CL 104 05/18/2021   CREATININE 0.68 05/18/2021   BUN 20 05/18/2021   CO2 33 (H) 05/18/2021   TSH 1.96 05/18/2021   HGBA1C 5.3 11/07/2012    Lab Results  Component Value Date   TSH 1.96 05/18/2021   Lab Results  Component Value Date   WBC 6.2 05/18/2021   HGB 14.4 05/18/2021   HCT 44.8 05/18/2021   MCV 93.8 05/18/2021   PLT 306.0 05/18/2021   Lab Results  Component Value Date   NA 141 05/18/2021   K 4.8 05/18/2021   CO2 33 (H) 05/18/2021   GLUCOSE 86 05/18/2021   BUN 20 05/18/2021   CREATININE 0.68 05/18/2021   BILITOT 0.9 05/18/2021   ALKPHOS 56 05/18/2021   AST 16 05/18/2021   ALT 14 05/18/2021   PROT 6.5 05/18/2021   ALBUMIN 4.5 05/18/2021   CALCIUM 9.7 05/18/2021   GFR 89.48 05/18/2021   Lab Results  Component Value Date   CHOL 179 10/04/2021   Lab Results  Component Value  Date   HDL 86.40 10/04/2021   Lab Results  Component Value Date   LDLCALC 81 10/04/2021   Lab Results  Component Value Date   TRIG 57.0 10/04/2021   Lab Results  Component Value Date   CHOLHDL 2 10/04/2021   Lab Results  Component Value Date   HGBA1C 5.3 11/07/2012       Assessment & Plan:      Problem List Items Addressed This Visit     Hyperlipidemia, mixed    Encourage heart healthy diet such as MIND or DASH diet, increase exercise, avoid trans fats, simple carbohydrates and processed foods, consider a krill or fish or flaxseed oil cap daily. Tolerating Atorvastatin      Cervical cancer screening    Pap today, no concerns on exam. Has stopped the Premarin after a couple of months.       Overweight    Encouraged DASH or MIND diet, decrease po intake and increase exercise as tolerated. Needs 7-8 hours of sleep nightly. Avoid trans fats, eat small, frequent meals every 4-5 hours with lean proteins, complex carbs and healthy fats. Minimize simple carbs, high fat foods and processed foods      Cough    Has been present for roughly 2 months. Has improved but persisted.  Xray ordered. Zpak ordered. Encouraged increased rest and hydration, add probiotics, zinc such as Coldeze or Xicam. Treat fevers as needed, report if symptoms worsen.       Relevant Orders   DG Chest 2 View   Other Visit Diagnoses     Encounter for cervical Pap smear with pelvic exam    -  Primary   Relevant Orders   Cytology - PAP       I have discontinued Lydia Guiles. [place]'s Premarin. I am also having her start on azithromycin. Additionally, I am having her maintain her aspirin EC, multivitamin with minerals, calcium carbonate, Vitamin D3, Krill Oil, Probiotic Product (PROBIOTIC DAILY PO), and atorvastatin.  Meds ordered this encounter  Medications   azithromycin (ZITHROMAX) 250 MG tablet    Sig: Take 2 tablets on day 1, then 1 tablet daily on days 2 through 5    Dispense:  6 tablet     Refill:  0

## 2021-10-07 ENCOUNTER — Ambulatory Visit (INDEPENDENT_AMBULATORY_CARE_PROVIDER_SITE_OTHER): Payer: Medicare Other | Admitting: Family Medicine

## 2021-10-07 ENCOUNTER — Other Ambulatory Visit: Payer: Medicare Other

## 2021-10-07 ENCOUNTER — Ambulatory Visit (HOSPITAL_BASED_OUTPATIENT_CLINIC_OR_DEPARTMENT_OTHER)
Admission: RE | Admit: 2021-10-07 | Discharge: 2021-10-07 | Disposition: A | Discharge status: Home / Self Care | Payer: Medicare Other | Source: Ambulatory Visit | Attending: Family Medicine | Admitting: Family Medicine

## 2021-10-07 ENCOUNTER — Encounter: Payer: Self-pay | Admitting: Family Medicine

## 2021-10-07 ENCOUNTER — Other Ambulatory Visit (HOSPITAL_COMMUNITY)
Admission: RE | Admit: 2021-10-07 | Discharge: 2021-10-07 | Disposition: A | Discharge status: Home / Self Care | Payer: Medicare Other | Source: Ambulatory Visit

## 2021-10-07 VITALS — BP 138/70 | HR 73 | Resp 20 | Ht 67.0 in | Wt 172.0 lb

## 2021-10-07 DIAGNOSIS — R059 Cough, unspecified: Secondary | ICD-10-CM | POA: Insufficient documentation

## 2021-10-07 DIAGNOSIS — Z780 Asymptomatic menopausal state: Secondary | ICD-10-CM | POA: Insufficient documentation

## 2021-10-07 DIAGNOSIS — E663 Overweight: Secondary | ICD-10-CM

## 2021-10-07 DIAGNOSIS — Z124 Encounter for screening for malignant neoplasm of cervix: Secondary | ICD-10-CM | POA: Diagnosis not present

## 2021-10-07 DIAGNOSIS — Z01419 Encounter for gynecological examination (general) (routine) without abnormal findings: Secondary | ICD-10-CM

## 2021-10-07 DIAGNOSIS — E782 Mixed hyperlipidemia: Secondary | ICD-10-CM

## 2021-10-07 DIAGNOSIS — Z1151 Encounter for screening for human papillomavirus (HPV): Secondary | ICD-10-CM | POA: Insufficient documentation

## 2021-10-07 MED ORDER — AZITHROMYCIN 250 MG PO TABS: ORAL_TABLET | ORAL | 0 refills | Status: AC

## 2021-10-07 NOTE — Assessment & Plan Note (Addendum)
Encourage heart healthy diet such as MIND or DASH diet, increase exercise, avoid trans fats, simple carbohydrates and processed foods, consider a krill or fish or flaxseed oil cap daily. Tolerating Atorvastatin 

## 2021-10-07 NOTE — Patient Instructions (Addendum)
Shingrix is the new shingles shot, 2 shots over 2-6 months, confirm coverage with insurance and document, then can return here for shots with nurse appt or at pharmacy  Encouraged increased rest and hydration, add probiotics, zinc such as Coldeze or Xicam or multivitamin Treat fevers as needed mucinex twice a day Cough, Adult Coughing is a reflex that clears your throat and your airways (respiratory system). Coughing helps to heal and protect your lungs. It is normal to cough occasionally, but a cough that happens with other symptoms or lasts a long time may be a sign of a condition that needs treatment. An acute cough may only last 2-3 weeks, while a chronic cough may last 8 or more weeks. Coughing is commonly caused by: Infection of the respiratory systemby viruses or bacteria. Breathing in substances that irritate your lungs. Allergies. Asthma. Mucus that runs down the back of your throat (postnasal drip). Smoking. Acid backing up from the stomach into the esophagus (gastroesophageal reflux). Certain medicines. Chronic lung problems. Other medical conditions such as heart failure or a blood clot in the lung (pulmonary embolism). Follow these instructions at home: Medicines Take over-the-counter and prescription medicines only as told by your health care provider. Talk with your health care provider before you take a cough suppressant medicine. Lifestyle  Avoid cigarette smoke. Do not use any products that contain nicotine or tobacco, such as cigarettes, e-cigarettes, and chewing tobacco. If you need help quitting, ask your health care provider. Drink enough fluid to keep your urine pale yellow. Avoid caffeine. Do not drink alcohol if your health care provider tells you not to drink. General instructions  Pay close attention to changes in your cough. Tell your health care provider about them. Always cover your mouth when you cough. Avoid things that make you cough, such as perfume,  candles, cleaning products, or campfire or tobacco smoke. If the air is dry, use a cool mist vaporizer or humidifier in your bedroom or your home to help loosen secretions. If your cough is worse at night, try to sleep in a semi-upright position. Rest as needed. Keep all follow-up visits as told by your health care provider. This is important. Contact a health care provider if you: Have new symptoms. Cough up pus. Have a cough that does not get better after 2-3 weeks or gets worse. Cannot control your cough with cough suppressant medicines and you are losing sleep. Have pain that gets worse or pain that is not helped with medicine. Have a fever. Have unexplained weight loss. Have night sweats. Get help right away if: You cough up blood. You have difficulty breathing. Your heartbeat is very fast. These symptoms may represent a serious problem that is an emergency. Do not wait to see if the symptoms will go away. Get medical help right away. Call your local emergency services (911 in the [place] Do not drive yourself to the hospital. Summary Coughing is a reflex that clears your throat and your airways. It is normal to cough occasionally, but a cough that happens with other symptoms or lasts a long time may be a sign of a condition that needs treatment. Take over-the-counter and prescription medicines only as told by your health care provider. Always cover your mouth when you cough. Contact a health care provider if you have new symptoms or a cough that does not get better after 2-3 weeks or gets worse. This information is not intended to replace advice given to you by your health care provider. Make  sure you discuss any questions you have with your health care provider. Document Revised: 04/23/2018 Document Reviewed: 04/23/2018 Elsevier Patient Education  [place].

## 2021-10-07 NOTE — Assessment & Plan Note (Signed)
Encouraged DASH or MIND diet, decrease po intake and increase exercise as tolerated. Needs 7-8 hours of sleep nightly. Avoid trans fats, eat small, frequent meals every 4-5 hours with lean proteins, complex carbs and healthy fats. Minimize simple carbs, high fat foods and processed foods 

## 2021-10-07 NOTE — Assessment & Plan Note (Signed)
Has been present for roughly 2 months. Has improved but persisted. Xray ordered. Zpak ordered. Encouraged increased rest and hydration, add probiotics, zinc such as Coldeze or Xicam. Treat fevers as needed, report if symptoms worsen.

## 2021-10-07 NOTE — Assessment & Plan Note (Signed)
Pap today, no concerns on exam. Has stopped the Premarin after a couple of months.

## 2021-10-11 LAB — CYTOLOGY - PAP
Comment: NEGATIVE
Diagnosis: NEGATIVE
High risk HPV: NEGATIVE

## 2022-02-01 ENCOUNTER — Other Ambulatory Visit (HOSPITAL_BASED_OUTPATIENT_CLINIC_OR_DEPARTMENT_OTHER): Payer: Self-pay

## 2022-02-01 MED ORDER — FLUAD QUADRIVALENT 0.5 ML IM PRSY
PREFILLED_SYRINGE | INTRAMUSCULAR | 0 refills | Status: DC
  Filled 2022-02-01: qty 0.5, 1d supply, fill #0

## 2022-03-29 ENCOUNTER — Other Ambulatory Visit: Payer: Self-pay | Admitting: Family Medicine

## 2022-07-05 DIAGNOSIS — H43813 Vitreous degeneration, bilateral: Secondary | ICD-10-CM | POA: Diagnosis not present

## 2022-07-05 DIAGNOSIS — H0102A Squamous blepharitis right eye, upper and lower eyelids: Secondary | ICD-10-CM | POA: Diagnosis not present

## 2022-07-05 DIAGNOSIS — H04123 Dry eye syndrome of bilateral lacrimal glands: Secondary | ICD-10-CM | POA: Diagnosis not present

## 2022-07-05 DIAGNOSIS — H2513 Age-related nuclear cataract, bilateral: Secondary | ICD-10-CM | POA: Diagnosis not present

## 2022-07-09 ENCOUNTER — Other Ambulatory Visit: Payer: Self-pay | Admitting: Family Medicine

## 2022-07-20 ENCOUNTER — Telehealth: Payer: Self-pay | Admitting: Family Medicine

## 2022-07-20 NOTE — Telephone Encounter (Signed)
Contacted Vilinda Flake to schedule their annual wellness visit. Appointment made for 07/28/2022.  Sherol Dade; Care Guide [facility] Direct Dial: [phone]

## 2022-07-25 ENCOUNTER — Ambulatory Visit: Payer: Medicare Other

## 2022-07-28 ENCOUNTER — Ambulatory Visit (INDEPENDENT_AMBULATORY_CARE_PROVIDER_SITE_OTHER): Payer: Medicare Other

## 2022-07-28 VITALS — Wt 180.0 lb

## 2022-07-28 DIAGNOSIS — Z0000 Encounter for general adult medical examination without abnormal findings: Secondary | ICD-10-CM | POA: Diagnosis not present

## 2022-07-28 NOTE — Patient Instructions (Signed)
Karen Pierce , Thank you for taking time to come for your Medicare Wellness Visit. I appreciate your ongoing commitment to your health goals. Please review the following plan we discussed and let me know if I can assist you in the future.   These are the goals we discussed:  Goals      Patient Stated     Get outdoors more        This is a list of the screening recommended for you and due dates:  Health Maintenance  Topic Date Due   Zoster (Shingles) Vaccine (1 of 2) Never done   Pneumonia Vaccine (1 of 1 - PCV) Never done   DTaP/Tdap/Td vaccine (3 - Td or Tdap) 07/22/2018   DEXA scan (bone density measurement)  01/02/2020   COVID-19 Vaccine (4 - 2023-24 season) 12/17/2021   Mammogram  03/15/2022   Flu Shot  11/17/2022   Medicare Annual Wellness Visit  07/28/2023   Colon Cancer Screening  08/27/2025   Hepatitis C Screening: USPSTF Recommendation to screen - Ages 18-79 yo.  Completed   HPV Vaccine  Aged Out    Advanced directives: Please bring a copy of your health care power of attorney and living will to the office to be added to your chart at your convenience.   Conditions/risks identified: Aim for 30 minutes of exercise or brisk walking, 6-8 glasses of water, and 5 servings of fruits and vegetables each day.   Next appointment: Follow up in one year for your annual wellness visit    Preventive Care 65 Years and Older, Female Preventive care refers to lifestyle choices and visits with your health care provider that can promote health and wellness. What does preventive care include? A yearly physical exam. This is also called an annual well check. Dental exams once or twice a year. Routine eye exams. Ask your health care provider how often you should have your eyes checked. Personal lifestyle choices, including: Daily care of your teeth and gums. Regular physical activity. Eating a healthy diet. Avoiding tobacco and drug use. Limiting alcohol use. Practicing safe  sex. Taking low-dose aspirin every day. Taking vitamin and mineral supplements as recommended by your health care provider. What happens during an annual well check? The services and screenings done by your health care provider during your annual well check will depend on your age, overall health, lifestyle risk factors, and family history of disease. Counseling  Your health care provider may ask you questions about your: Alcohol use. Tobacco use. Drug use. Emotional well-being. Home and relationship well-being. Sexual activity. Eating habits. History of falls. Memory and ability to understand (cognition). Work and work Astronomer. Reproductive health. Screening  You may have the following tests or measurements: Height, weight, and BMI. Blood pressure. Lipid and cholesterol levels. These may be checked every 5 years, or more frequently if you are over 35 years old. Skin check. Lung cancer screening. You may have this screening every year starting at age 71 if you have a 30-pack-year history of smoking and currently smoke or have quit within the past 15 years. Fecal occult blood test (FOBT) of the stool. You may have this test every year starting at age 77. Flexible sigmoidoscopy or colonoscopy. You may have a sigmoidoscopy every 5 years or a colonoscopy every 10 years starting at age 31. Hepatitis C blood test. Hepatitis B blood test. Sexually transmitted disease (STD) testing. Diabetes screening. This is done by checking your blood sugar (glucose) after you have not eaten  for a while (fasting). You may have this done every 1-3 years. Bone density scan. This is done to screen for osteoporosis. You may have this done starting at age 9. Mammogram. This may be done every 1-2 years. Talk to your health care provider about how often you should have regular mammograms. Talk with your health care provider about your test results, treatment options, and if necessary, the need for more  tests. Vaccines  Your health care provider may recommend certain vaccines, such as: Influenza vaccine. This is recommended every year. Tetanus, diphtheria, and acellular pertussis (Tdap, Td) vaccine. You may need a Td booster every 10 years. Zoster vaccine. You may need this after age 61. Pneumococcal 13-valent conjugate (PCV13) vaccine. One dose is recommended after age 42. Pneumococcal polysaccharide (PPSV23) vaccine. One dose is recommended after age 79. Talk to your health care provider about which screenings and vaccines you need and how often you need them. This information is not intended to replace advice given to you by your health care provider. Make sure you discuss any questions you have with your health care provider. Document Released: 05/01/2015 Document Revised: 12/23/2015 Document Reviewed: 02/03/2015 Elsevier Interactive Patient Education  2017 [place] Prevention in the Home Falls can cause injuries. They can happen to people of all ages. There are many things you can do to make your home safe and to help prevent falls. What can I do on the outside of my home? Regularly fix the edges of walkways and driveways and fix any cracks. Remove anything that might make you trip as you walk through a door, such as a raised step or threshold. Trim any bushes or trees on the path to your home. Use bright outdoor lighting. Clear any walking paths of anything that might make someone trip, such as rocks or tools. Regularly check to see if handrails are loose or broken. Make sure that both sides of any steps have handrails. Any raised decks and porches should have guardrails on the edges. Have any leaves, snow, or ice cleared regularly. Use sand or salt on walking paths during winter. Clean up any spills in your garage right away. This includes oil or grease spills. What can I do in the bathroom? Use night lights. Install grab bars by the toilet and in the tub and shower.  Do not use towel bars as grab bars. Use non-skid mats or decals in the tub or shower. If you need to sit down in the shower, use a plastic, non-slip stool. Keep the floor dry. Clean up any water that spills on the floor as soon as it happens. Remove soap buildup in the tub or shower regularly. Attach bath mats securely with double-sided non-slip rug tape. Do not have throw rugs and other things on the floor that can make you trip. What can I do in the bedroom? Use night lights. Make sure that you have a light by your bed that is easy to reach. Do not use any sheets or blankets that are too big for your bed. They should not hang down onto the floor. Have a firm chair that has side arms. You can use this for support while you get dressed. Do not have throw rugs and other things on the floor that can make you trip. What can I do in the kitchen? Clean up any spills right away. Avoid walking on wet floors. Keep items that you use a lot in easy-to-reach places. If you need to reach something  above you, use a strong step stool that has a grab bar. Keep electrical cords out of the way. Do not use floor polish or wax that makes floors slippery. If you must use wax, use non-skid floor wax. Do not have throw rugs and other things on the floor that can make you trip. What can I do with my stairs? Do not leave any items on the stairs. Make sure that there are handrails on both sides of the stairs and use them. Fix handrails that are broken or loose. Make sure that handrails are as long as the stairways. Check any carpeting to make sure that it is firmly attached to the stairs. Fix any carpet that is loose or worn. Avoid having throw rugs at the top or bottom of the stairs. If you do have throw rugs, attach them to the floor with carpet tape. Make sure that you have a light switch at the top of the stairs and the bottom of the stairs. If you do not have them, ask someone to add them for you. What else  can I do to help prevent falls? Wear shoes that: Do not have high heels. Have rubber bottoms. Are comfortable and fit you well. Are closed at the toe. Do not wear sandals. If you use a stepladder: Make sure that it is fully opened. Do not climb a closed stepladder. Make sure that both sides of the stepladder are locked into place. Ask someone to hold it for you, if possible. Clearly mark and make sure that you can see: Any grab bars or handrails. First and last steps. Where the edge of each step is. Use tools that help you move around (mobility aids) if they are needed. These include: Canes. Walkers. Scooters. Crutches. Turn on the lights when you go into a dark area. Replace any light bulbs as soon as they burn out. Set up your furniture so you have a clear path. Avoid moving your furniture around. If any of your floors are uneven, fix them. If there are any pets around you, be aware of where they are. Review your medicines with your doctor. Some medicines can make you feel dizzy. This can increase your chance of falling. Ask your doctor what other things that you can do to help prevent falls. This information is not intended to replace advice given to you by your health care provider. Make sure you discuss any questions you have with your health care provider. Document Released: 01/29/2009 Document Revised: 09/10/2015 Document Reviewed: 05/09/2014 Elsevier Interactive Patient Education  2017 [employer].

## 2022-07-28 NOTE — Progress Notes (Signed)
Subjective:   Karen Pierce is a 72 y.o. female who presents for Medicare Annual (Subsequent) preventive examination.  I connected with  Karen LeepKaren S Conly on 07/28/22 by a audio enabled telemedicine application and verified that I am speaking with the correct person using two identifiers.  Patient Location: Home  Provider Location: Home Office  I discussed the limitations of evaluation and management by telemedicine. The patient expressed understanding and agreed to proceed.   Review of Systems     Cardiac Risk Factors include: advanced age (>6255men, 69>65 women);dyslipidemia;Other (see comment), Risk factor comments: PVD     Objective:    Today's Vitals   07/28/22 1036  Weight: 180 lb (81.6 kg)   Body mass index is 28.19 kg/m.     07/28/2022   10:42 AM 07/16/2021   11:45 AM 01/15/2015    9:04 AM  Advanced Directives  Does Patient Have a Medical Advance Directive? Yes Yes Yes  Type of Estate agentAdvance Directive Healthcare Power of RiverdaleAttorney;Living will Healthcare Power of BarrettAttorney;Living will Healthcare Power of PitmanAttorney;Living will  Copy of Healthcare Power of Attorney in Chart? No - copy requested No - copy requested     Current Medications (verified) Outpatient Encounter Medications as of 07/28/2022  Medication Sig   aspirin 81 MG EC tablet Take 81 mg by mouth daily.   atorvastatin (LIPITOR) 10 MG tablet Take 1 tablet (10 mg total) by mouth daily.   calcium carbonate (TUMS - DOSED IN MG ELEMENTAL CALCIUM) 500 MG chewable tablet Chew 1 tablet by mouth daily.   Cholecalciferol (VITAMIN D3) 5000 UNITS CAPS Take 1 capsule by mouth daily.   Krill Oil CAPS Take 1 capsule by mouth daily.    Multiple Vitamins-Minerals (MULTIVITAMIN WITH MINERALS) tablet Take 1 tablet by mouth daily.   Probiotic Product (PROBIOTIC DAILY PO) Take 1 capsule by mouth daily.    [DISCONTINUED] influenza vaccine adjuvanted (FLUAD QUADRIVALENT) 0.5 ML injection Inject into the muscle.   No facility-administered  encounter medications on file as of 07/28/2022.    Allergies (verified) Patient has no known allergies.   History: Past Medical History:  Diagnosis Date   Anxiety and depression 11/16/2012   Breast cyst    dense   Chicken pox as a kid   Colon polyp    GERD (gastroesophageal reflux disease)    Hiatal hernia    History of colonic polyps 01/15/2015   History of shingles 10/04/2012   Hyperlipidemia    Kidney stone 10/04/2012   Other and unspecified hyperlipidemia 10/04/2012   Overweight 11/29/2016   Preventative health care 10/04/2012   Renal artery stenosis    kidney stone   Past Surgical History:  Procedure Laterality Date   cyst removed off left side  1968   benign, skin   POLPYPECTOMY  08/2004   Family History  Problem Relation Age of Onset   Arthritis Mother    Heart disease Mother    Cancer Mother [id number]       breast, sarcoma x 3 bouts in past, under righ arm   Hyperlipidemia Mother    Ulcers Father    Hypertension Father    Arthritis Father    Heart disease Father        CHF   Macular degeneration Father    Heart disease Maternal Grandmother    Heart disease Paternal Grandmother    Heart disease Paternal Grandfather    Ulcers Paternal Grandfather    Cancer Brother        pancreatic  Heart disease Maternal Grandfather    Heart attack Maternal Grandfather    Pneumonia Sister    Arthritis Sister        rheumatoid   Arthritis Sister        s/p TKR   Social History   Socioeconomic History   Marital status: Married    Spouse name: Pam   Number of children: Not on file   Years of education: Not on file   Highest education level: Not on file  Occupational History   Not on file  Tobacco Use   Smoking status: Former    Packs/day: 0.50    Years: 10.00    Additional pack years: 0.00    Total pack years: 5.00    Types: Cigarettes    Start date: 04/18/1988   Smokeless tobacco: Never  Vaping Use   Vaping Use: Never used  Substance and Sexual Activity   Alcohol  use: Yes    Comment: occasionally   Drug use: No   Sexual activity: Yes    Partners: Male  Other Topics Concern   Not on file  Social History Narrative   Not on file   Social Determinants of Health   Financial Resource Strain: Low Risk  (07/28/2022)   Overall Financial Resource Strain (CARDIA)    Difficulty of Paying Living Expenses: Not hard at all  Food Insecurity: No Food Insecurity (07/28/2022)   Hunger Vital Sign    Worried About Running Out of Food in the Last Year: Never true    Ran Out of Food in the Last Year: Never true  Transportation Needs: No Transportation Needs (07/28/2022)   PRAPARE - Administrator, Civil Service (Medical): No    Lack of Transportation (Non-Medical): No  Physical Activity: Insufficiently Active (07/28/2022)   Exercise Vital Sign    Days of Exercise per Week: 7 days    Minutes of Exercise per Session: 20 min  Stress: No Stress Concern Present (07/28/2022)   [employer] of Occupational Health - Occupational Stress Questionnaire    Feeling of Stress : Only a little  Social Connections: Moderately Isolated (07/28/2022)   Social Connection and Isolation Panel [NHANES]    Frequency of Communication with Friends and Family: More than three times a week    Frequency of Social Gatherings with Friends and Family: More than three times a week    Attends Religious Services: Never    Database administrator or Organizations: No    Attends Engineer, structural: Never    Marital Status: Married    Tobacco Counseling Counseling given: Not Answered   Clinical Intake:  Pre-visit preparation completed: Yes  Pain : No/denies pain     BMI - recorded: 28.19 Nutritional Status: BMI 25 -29 Overweight Nutritional Risks: None Diabetes: No  How often do you need to have someone help you when you read instructions, pamphlets, or other written materials from your doctor or pharmacy?: 1 - Never  Diabetic? no  Interpreter Needed?:  No  Information entered by :: Sparrow [place], LPN   Activities of Daily Living    07/28/2022   10:43 AM  In your present state of health, do you have any difficulty performing the following activities:  Hearing? 0  Vision? 0  Difficulty concentrating or making decisions? 0  Walking or climbing stairs? 0  Dressing or bathing? 0  Doing errands, shopping? 0  Preparing Food and eating ? N  Using the Toilet? N  In the past  six months, have you accidently leaked urine? N  Do you have problems with loss of bowel control? N  Managing your Medications? N  Managing your Finances? N  Housekeeping or managing your Housekeeping? N    Patient Care Team: Bradd Canary, MD as PCP - General (Family Medicine) Kathleene Hazel, MD as Consulting Physician (Cardiology)  Indicate any recent Medical Services you may have received from other than [facility] providers in the past year (date may be approximate).     Assessment:   This is a routine wellness examination for Janita.  Hearing/Vision screen Hearing Screening - Comments:: Denies hearing difficulties   Vision Screening - Comments:: Wears reading glasses - up to date with routine eye exams with [facility]  Dietary issues and exercise activities discussed: Current Exercise Habits: Home exercise routine, Type of exercise: walking;Other - see comments (working outdoors), Time (Minutes): 20, Frequency (Times/Week): 7, Weekly Exercise (Minutes/Week): 140, Intensity: Mild, Exercise limited by: None identified   Goals Addressed             This Visit's Progress    Patient Stated       Get outdoors more       Depression Screen    07/28/2022   10:41 AM 10/07/2021    1:44 PM 07/16/2021   11:46 AM 05/17/2021    3:28 PM 05/17/2021    3:25 PM 12/04/2017    8:25 AM  PHQ 2/9 Scores  PHQ - 2 Score 0 0 0 0 0 0  PHQ- 9 Score  0 0       Fall Risk    07/28/2022   10:38 AM 10/07/2021    1:44 PM 07/16/2021   11:46 AM 05/17/2021    3:27 PM  05/17/2021    3:25 PM  Fall Risk   Falls in the past year? 0 0 0 0 0  Number falls in past yr: 0 0 0 0 0  Injury with Fall? 0 0 0 0 0  Risk for fall due to : No Fall Risks No Fall Risks Medication side effect No Fall Risks No Fall Risks  Follow up Falls prevention discussed Falls evaluation completed Falls evaluation completed;Education provided;Falls prevention discussed Falls evaluation completed     FALL RISK PREVENTION PERTAINING TO THE HOME:  Any stairs in or around the home? Yes  If so, are there any without handrails? No  Home free of loose throw rugs in walkways, pet beds, electrical cords, etc? No  couple throw rugs Adequate lighting in your home to reduce risk of falls? Yes   ASSISTIVE DEVICES UTILIZED TO PREVENT FALLS:  Life alert? No  Use of a cane, walker or w/c? No  Grab bars in the bathroom? Yes  Shower chair or bench in shower? No  Elevated toilet seat or a handicapped toilet? Yes   TIMED UP AND GO:  Was the test performed? No . televisit  Cognitive Function:        07/28/2022   10:43 AM 07/16/2021   11:48 AM  6CIT Screen  What Year? 0 points 0 points  What month? 0 points 0 points  What time? 0 points 0 points  Count back from 20 0 points 0 points  Months in reverse 0 points 2 points  Repeat phrase 0 points 0 points  Total Score 0 points 2 points    Immunizations Immunization History  Administered Date(s) Administered   Fluad Quad(high Dose 65+) 02/01/2022   Influenza Split 01/27/2012   Influenza-Unspecified 02/02/2015,  04/17/2021   PFIZER(Purple Top)SARS-COV-2 Vaccination 07/25/2019, 08/20/2019, 03/24/2020   Td 10/24/1997   Tdap 07/21/2008   Zoster, Live 01/15/2015    TDAP status: Due, Education has been provided regarding the importance of this vaccine. Advised may receive this vaccine at local pharmacy or Health Dept. Aware to provide a copy of the vaccination record if obtained from local pharmacy or Health Dept. Verbalized acceptance and  understanding.  Flu Vaccine status: Up to date  Pneumococcal vaccine status: Due, Education has been provided regarding the importance of this vaccine. Advised may receive this vaccine at local pharmacy or Health Dept. Aware to provide a copy of the vaccination record if obtained from local pharmacy or Health Dept. Verbalized acceptance and understanding.  Covid-19 vaccine status: Information provided on how to obtain vaccines.   Qualifies for Shingles Vaccine? Yes   Zostavax completed Yes   Shingrix Completed?: No.    Education has been provided regarding the importance of this vaccine. Patient has been advised to call insurance company to determine out of pocket expense if they have not yet received this vaccine. Advised may also receive vaccine at local pharmacy or Health Dept. Verbalized acceptance and understanding.  Screening Tests Health Maintenance  Topic Date Due   Zoster Vaccines- Shingrix (1 of 2) Never done   Pneumonia Vaccine 20+ Years old (1 of 1 - PCV) Never done   DTaP/Tdap/Td (3 - Td or Tdap) 07/22/2018   DEXA SCAN  01/02/2020   COVID-19 Vaccine (4 - 2023-24 season) 12/17/2021   MAMMOGRAM  03/15/2022   INFLUENZA VACCINE  11/17/2022   Medicare Annual Wellness (AWV)  07/28/2023   COLONOSCOPY (Pts 45-32yrs Insurance coverage will need to be confirmed)  08/27/2025   Hepatitis C Screening  Completed   HPV VACCINES  Aged Out    Health Maintenance  Health Maintenance Due  Topic Date Due   Zoster Vaccines- Shingrix (1 of 2) Never done   Pneumonia Vaccine 16+ Years old (1 of 1 - PCV) Never done   DTaP/Tdap/Td (3 - Td or Tdap) 07/22/2018   DEXA SCAN  01/02/2020   COVID-19 Vaccine (4 - 2023-24 season) 12/17/2021   MAMMOGRAM  03/15/2022    Colorectal cancer screening: Type of screening: Colonoscopy. Completed 08/28/2015. Repeat every 10 years  Mammogram status: Completed 03/15/2021. Repeat every year - declined to schedule at this time.  Bone Density status: Completed  01/01/2018. Results reflect: Bone density results: OSTEOPENIA. Repeat every 2 years. declined to schedule at this time.  Lung Cancer Screening: (Low Dose CT Chest recommended if Age 65-80 years, 30 pack-year currently smoking OR have quit w/in 15years.) does not qualify.   Additional Screening:  Hepatitis C Screening: does qualify; Completed 01/15/2015  Vision Screening: Recommended annual ophthalmology exams for early detection of glaucoma and other disorders of the eye. Is the patient up to date with their annual eye exam?  Yes  Who is the provider or what is the name of the office in which the patient attends annual eye exams? [facility] If pt is not established with a provider, would they like to be referred to a provider to establish care? No .   Dental Screening: Recommended annual dental exams for proper oral hygiene  Community Resource Referral / Chronic Care Management: CRR required this visit?  No   CCM required this visit?  No      Plan:     I have personally reviewed and noted the following in the patient's chart:   Medical and social  history Use of alcohol, tobacco or illicit drugs  Current medications and supplements including opioid prescriptions. Patient is not currently taking opioid prescriptions. Functional ability and status Nutritional status Physical activity Advanced directives List of other physicians Hospitalizations, surgeries, and ER visits in previous 12 months Vitals Screenings to include cognitive, depression, and falls Referrals and appointments  In addition, I have reviewed and discussed with patient certain preventive protocols, quality metrics, and best practice recommendations. A written personalized care plan for preventive services as well as general preventive health recommendations were provided to patient.     [place] Constable, LPN   1/61/0960   Nurse Notes: Declined dexa and mammo referrals today - will consider PNA and SHX vaccines. Will  call back to make annual appt with Abner Greenspan - would prefer virtual.

## 2022-10-01 ENCOUNTER — Other Ambulatory Visit: Payer: Self-pay | Admitting: Family Medicine

## 2022-10-03 MED ORDER — ATORVASTATIN CALCIUM 10 MG PO TABS: 10.0000 mg | ORAL_TABLET | Freq: Every day | ORAL | 3 refills | Status: DC

## 2022-10-03 NOTE — Telephone Encounter (Signed)
See note. She is overdue for labs (March). Did you want her to have them elsewhere or check when she comes in in September?

## 2022-12-01 ENCOUNTER — Encounter (INDEPENDENT_AMBULATORY_CARE_PROVIDER_SITE_OTHER): Payer: Self-pay

## 2023-01-08 NOTE — Assessment & Plan Note (Signed)
Avoid offending foods, start probiotics. Do not eat large meals in late evening and consider raising head of bed.

## 2023-01-08 NOTE — Assessment & Plan Note (Signed)
Encourage heart healthy diet such as MIND or DASH diet, increase exercise, avoid trans fats, simple carbohydrates and processed foods, consider a krill or fish or flaxseed oil cap daily. Tolerating Atorvastatin 

## 2023-01-08 NOTE — Assessment & Plan Note (Signed)
Doing well without medicaitons at this time

## 2023-01-09 ENCOUNTER — Ambulatory Visit: Payer: Medicare Other | Admitting: Family Medicine

## 2023-01-09 ENCOUNTER — Other Ambulatory Visit (HOSPITAL_BASED_OUTPATIENT_CLINIC_OR_DEPARTMENT_OTHER): Payer: Self-pay | Admitting: Family Medicine

## 2023-01-09 VITALS — BP 124/72 | HR 67 | Temp 98.0°F | Resp 16 | Ht 67.0 in | Wt 179.4 lb

## 2023-01-09 DIAGNOSIS — K219 Gastro-esophageal reflux disease without esophagitis: Secondary | ICD-10-CM | POA: Diagnosis not present

## 2023-01-09 DIAGNOSIS — Z1231 Encounter for screening mammogram for malignant neoplasm of breast: Secondary | ICD-10-CM

## 2023-01-09 DIAGNOSIS — E782 Mixed hyperlipidemia: Secondary | ICD-10-CM

## 2023-01-09 DIAGNOSIS — F32A Depression, unspecified: Secondary | ICD-10-CM

## 2023-01-09 DIAGNOSIS — Z23 Encounter for immunization: Secondary | ICD-10-CM

## 2023-01-09 DIAGNOSIS — F419 Anxiety disorder, unspecified: Secondary | ICD-10-CM | POA: Diagnosis not present

## 2023-01-09 NOTE — Patient Instructions (Addendum)
Shingrix is the new shingles shot, 2 shots over 2-6 months, confirm coverage with insurance and document, then can return here for shots with nurse appt or at pharmacy   Covid and flu boosters annually  Tetanus booster at pharmacy  Prevnar 20 pneumonia shot at pharmacy   Arexvy, RSV, Respiratory Syncitial Vir .00.........[place] Kitchen[place] vaccine at pharmacy at 77

## 2023-01-09 NOTE — Progress Notes (Signed)
Subjective:    Patient ID: Karen Pierce, female    DOB: 1950/11/24, 72 y.o.   MRN: [id number]  Chief Complaint  Patient presents with  . Follow-up    FOLLOW UP    HPI Discussed the use of AI scribe software for clinical note transcription with the patient, who gave verbal consent to proceed.  History of Present Illness   The patient, with a history of hypertension, presents with a persistent cough, particularly noticeable in the morning. They suspect it might be due to allergies, as they have a history of postnasal drip. The cough does not seem to be associated with any other symptoms such as fever, chills, shortness of breath, or chest congestion.        Past Medical History:  Diagnosis Date  . Anxiety and depression 11/16/2012  . Breast cyst    dense  . Chicken pox as a kid  . Colon polyp   . GERD (gastroesophageal reflux disease)   . Hiatal hernia   . History of colonic polyps 01/15/2015  . History of shingles 10/04/2012  . Hyperlipidemia   . Kidney stone 10/04/2012  . Other and unspecified hyperlipidemia 10/04/2012  . Overweight 11/29/2016  . Preventative health care 10/04/2012  . Renal artery stenosis (HCC)    kidney stone    Past Surgical History:  Procedure Laterality Date  . cyst removed off left side  1968   benign, skin  . POLPYPECTOMY  08/2004    Family History  Problem Relation Age of Onset  . Arthritis Mother   . Heart disease Mother   . Cancer Mother 73       breast, sarcoma x 3 bouts in past, under righ arm  . Hyperlipidemia Mother   . Ulcers Father   . Hypertension Father   . Arthritis Father   . Heart disease Father        CHF  . Macular degeneration Father   . Heart disease Maternal Grandmother   . Heart disease Paternal Grandmother   . Heart disease Paternal Grandfather   . Ulcers Paternal Grandfather   . Cancer Brother        pancreatic  . Heart disease Maternal Grandfather   . Heart attack Maternal Grandfather   . Pneumonia Sister    . Arthritis Sister        rheumatoid  . Arthritis Sister        s/p TKR    Social History   Socioeconomic History  . Marital status: Married    Spouse name: Pam  . Number of children: Not on file  . Years of education: Not on file  . Highest education level: 12th grade  Occupational History  . Not on file  Tobacco Use  . Smoking status: Former    Current packs/day: 0.50    Average packs/day: 0.5 packs/day for 34.7 years (17.4 ttl pk-yrs)    Types: Cigarettes    Start date: 04/18/1988  . Smokeless tobacco: Never  Vaping Use  . Vaping status: Never Used  Substance and Sexual Activity  . Alcohol use: Yes    Comment: occasionally  . Drug use: No  . Sexual activity: Yes    Partners: Male  Other Topics Concern  . Not on file  Social History Narrative  . Not on file   Social Determinants of Health   Financial Resource Strain: Low Risk  (01/09/2023)   Overall Financial Resource Strain (CARDIA)   . Difficulty of Paying Living Expenses: Not  hard at all  Food Insecurity: No Food Insecurity (01/09/2023)   Hunger Vital Sign   . Worried About Programme researcher, broadcasting/film/video in the Last Year: Never true   . Ran Out of Food in the Last Year: Never true  Transportation Needs: No Transportation Needs (01/09/2023)   PRAPARE - Transportation   . Lack of Transportation (Medical): No   . Lack of Transportation (Non-Medical): No  Physical Activity: Insufficiently Active (01/09/2023)   Exercise Vital Sign   . Days of Exercise per Week: 1 day   . Minutes of Exercise per Session: 40 min  Stress: No Stress Concern Present (01/09/2023)   [employer] of Occupational Health - Occupational Stress Questionnaire   . Feeling of Stress : Not at all  Social Connections: Moderately Integrated (01/09/2023)   Social Connection and Isolation Panel [NHANES]   . Frequency of Communication with Friends and Family: Once a week   . Frequency of Social Gatherings with Friends and Family: More than three times a  week   . Attends Religious Services: 1 to 4 times per year   . Active Member of Clubs or Organizations: No   . Attends Banker Meetings: Never   . Marital Status: Married  Catering manager Violence: Not At Risk (07/28/2022)   Humiliation, Afraid, Rape, and Kick questionnaire   . Fear of Current or Ex-Partner: No   . Emotionally Abused: No   . Physically Abused: No   . Sexually Abused: No    Outpatient Medications Prior to Visit  Medication Sig Dispense Refill  . aspirin 81 MG EC tablet Take 81 mg by mouth daily.    Marland Kitchen atorvastatin (LIPITOR) 10 MG tablet Take 1 tablet (10 mg total) by mouth daily. 30 tablet 3  . calcium carbonate (TUMS - DOSED IN MG ELEMENTAL CALCIUM) 500 MG chewable tablet Chew 1 tablet by mouth daily.    . Cholecalciferol (VITAMIN D3) 5000 UNITS CAPS Take 1 capsule by mouth daily.    [facility] Lan[facility] CAPS Take 1 capsule by mouth daily.     . Multiple Vitamins-Minerals (MULTIVITAMIN WITH MINERALS) tablet Take 1 tablet by mouth daily.    . Probiotic Product (PROBIOTIC DAILY PO) Take 1 capsule by mouth daily.      No facility-administered medications prior to visit.    No Known Allergies  Review of Systems  Constitutional:  Negative for fever and malaise/fatigue.  HENT:  Negative for congestion.   Eyes:  Negative for blurred vision.  Respiratory:  Positive for cough and sputum production. Negative for shortness of breath.   Cardiovascular:  Negative for chest pain, palpitations and leg swelling.  Gastrointestinal:  Negative for abdominal pain, blood in stool and nausea.  Genitourinary:  Negative for dysuria and frequency.  Musculoskeletal:  Negative for falls.  Skin:  Negative for rash.  Neurological:  Negative for dizziness, loss of consciousness and headaches.  Endo/Heme/Allergies:  Negative for environmental allergies.  Psychiatric/Behavioral:  Negative for depression. The patient is not nervous/anxious.        Objective:    Physical  Exam Constitutional:      General: She is not in acute distress.    Appearance: Normal appearance. She is well-developed. She is not toxic-appearing.  HENT:     Head: Normocephalic and atraumatic.     Right Ear: External ear normal.     Left Ear: External ear normal.     Nose: Nose normal.  Eyes:     General:  Right eye: No discharge.        Left eye: No discharge.     Conjunctiva/sclera: Conjunctivae normal.  Neck:     Thyroid: No thyromegaly.  Cardiovascular:     Rate and Rhythm: Normal rate and regular rhythm.     Heart sounds: Normal heart sounds. No murmur heard. Pulmonary:     Effort: Pulmonary effort is normal. No respiratory distress.     Breath sounds: Normal breath sounds.  Abdominal:     General: Bowel sounds are normal.     Palpations: Abdomen is soft.     Tenderness: There is no abdominal tenderness. There is no guarding.  Musculoskeletal:        General: Normal range of motion.     Cervical back: Neck supple.  Lymphadenopathy:     Cervical: No cervical adenopathy.  Skin:    General: Skin is warm and dry.  Neurological:     Mental Status: She is alert and oriented to person, place, and time.  Psychiatric:        Mood and Affect: Mood normal.        Behavior: Behavior normal.        Thought Content: Thought content normal.        Judgment: Judgment normal.    BP 124/72 (BP Location: Left Arm, Patient Position: Sitting, Cuff Size: Normal)   Pulse 67   Temp 98 F (36.7 C) (Oral)   Resp 16   Ht 5\' 7"  (1.702 m)   Wt 179 lb 6.4 oz (81.4 kg)   LMP 04/19/2007   SpO2 95%   BMI 28.10 kg/m  Wt Readings from Last 3 Encounters:  01/09/23 179 lb 6.4 oz (81.4 kg)  07/28/22 180 lb (81.6 kg)  10/07/21 172 lb (78 kg)    Diabetic Foot Exam - Simple   No data filed    Lab Results  Component Value Date   WBC 6.2 05/18/2021   HGB 14.4 05/18/2021   HCT 44.8 05/18/2021   PLT 306.0 05/18/2021   GLUCOSE 86 05/18/2021   CHOL 179 10/04/2021   TRIG 57.0  10/04/2021   HDL 86.40 10/04/2021   LDLCALC 81 10/04/2021   ALT 14 05/18/2021   AST 16 05/18/2021   NA 141 05/18/2021   K 4.8 05/18/2021   CL 104 05/18/2021   CREATININE 0.68 05/18/2021   BUN 20 05/18/2021   CO2 33 (H) 05/18/2021   TSH 1.96 05/18/2021   HGBA1C 5.3 11/07/2012    Lab Results  Component Value Date   TSH 1.96 05/18/2021   Lab Results  Component Value Date   WBC 6.2 05/18/2021   HGB 14.4 05/18/2021   HCT 44.8 05/18/2021   MCV 93.8 05/18/2021   PLT 306.0 05/18/2021   Lab Results  Component Value Date   NA 141 05/18/2021   K 4.8 05/18/2021   CO2 33 (H) 05/18/2021   GLUCOSE 86 05/18/2021   BUN 20 05/18/2021   CREATININE 0.68 05/18/2021   BILITOT 0.9 05/18/2021   ALKPHOS 56 05/18/2021   AST 16 05/18/2021   ALT 14 05/18/2021   PROT 6.5 05/18/2021   ALBUMIN 4.5 05/18/2021   CALCIUM 9.7 05/18/2021   GFR 89.48 05/18/2021   Lab Results  Component Value Date   CHOL 179 10/04/2021   Lab Results  Component Value Date   HDL 86.40 10/04/2021   Lab Results  Component Value Date   LDLCALC 81 10/04/2021   Lab Results  Component Value Date  TRIG 57.0 10/04/2021   Lab Results  Component Value Date   CHOLHDL 2 10/04/2021   Lab Results  Component Value Date   HGBA1C 5.3 11/07/2012       Assessment & Plan:  Hyperlipidemia, mixed Assessment & Plan: Encourage heart healthy diet such as MIND or DASH diet, increase exercise, avoid trans fats, simple carbohydrates and processed foods, consider a krill or fish or flaxseed oil cap daily. Tolerating Atorvastatin  Orders: -     Comprehensive metabolic panel -     Lipid panel -     TSH  Gastroesophageal reflux disease without esophagitis Assessment & Plan: Avoid offending foods, start probiotics. Do not eat large meals in late evening and consider raising head of bed.   Orders: -     CBC with Differential/Platelet -     Comprehensive metabolic panel -     TSH  Anxiety and depression Assessment &  Plan: Doing well without medicaitons at this time   Need for influenza vaccination -     Flu Vaccine Trivalent High Dose (Fluad)    Assessment and Plan    Chronic Cough Morning cough likely secondary to postnasal drip. No associated fever, chills, shortness of breath, or chest congestion. -Trial of daily antihistamine and Mucinex for a few weeks. -Consider referral to ENT and Gastroenterology if no improvement.  Dysphagia Difficulty swallowing large pills, possibly due to age-related changes in the GI tract. -Consider referral to Gastroenterology if symptoms worsen.  General Health Maintenance -Order routine blood work.  -Plan to receive COVID booster in November. -Received Shingrix vaccine.  Follow-up in 6 months for an annual visit or sooner if needed.         Danise Edge, MD

## 2023-01-10 LAB — CBC WITH DIFFERENTIAL/PLATELET
Basophils Absolute: 0.1 10*3/uL (ref 0.0–0.1)
Basophils Relative: 1.1 % (ref 0.0–3.0)
Eosinophils Absolute: 0.1 10*3/uL (ref 0.0–0.7)
Eosinophils Relative: 1.6 % (ref 0.0–5.0)
HCT: 44.1 % (ref 36.0–46.0)
Hemoglobin: 14.3 g/dL (ref 12.0–15.0)
Lymphocytes Relative: 32 % (ref 12.0–46.0)
Lymphs Abs: 2.4 10*3/uL (ref 0.7–4.0)
MCHC: 32.5 g/dL (ref 30.0–36.0)
MCV: 94.9 fl (ref 78.0–100.0)
Monocytes Absolute: 0.6 10*3/uL (ref 0.1–1.0)
Monocytes Relative: 7.6 % (ref 3.0–12.0)
Neutro Abs: 4.3 10*3/uL (ref 1.4–7.7)
Neutrophils Relative %: 57.7 % (ref 43.0–77.0)
Platelets: 324 10*3/uL (ref 150.0–400.0)
RBC: 4.65 Mil/uL (ref 3.87–5.11)
RDW: 13.1 % (ref 11.5–15.5)
WBC: 7.4 10*3/uL (ref 4.0–10.5)

## 2023-01-10 LAB — LIPID PANEL
Cholesterol: 168 mg/dL (ref 0–200)
HDL: 82.9 mg/dL (ref >39.00)
LDL Cholesterol: 73 mg/dL (ref 0–99)
NonHDL: 84.81
Total CHOL/HDL Ratio: 2
Triglycerides: 61 mg/dL (ref 0.0–149.0)
VLDL: 12.2 mg/dL (ref 0.0–40.0)

## 2023-01-10 LAB — COMPREHENSIVE METABOLIC PANEL
ALT: 15 U/L (ref 0–35)
AST: 16 U/L (ref 0–37)
Albumin: 4.3 g/dL (ref 3.5–5.2)
Alkaline Phosphatase: 59 U/L (ref 39–117)
BUN: 18 mg/dL (ref 6–23)
CO2: 27 mEq/L (ref 19–32)
Calcium: 9.3 mg/dL (ref 8.4–10.5)
Chloride: 104 mEq/L (ref 96–112)
Creatinine, Ser: 0.71 mg/dL (ref 0.40–1.20)
GFR: 86.36 mL/min (ref >60.00)
Glucose, Bld: 85 mg/dL (ref 70–99)
Potassium: 4.1 mEq/L (ref 3.5–5.1)
Sodium: 142 mEq/L (ref 135–145)
Total Bilirubin: 0.6 mg/dL (ref 0.2–1.2)
Total Protein: 6.3 g/dL (ref 6.0–8.3)

## 2023-01-10 LAB — TSH: TSH: 1.84 u[IU]/mL (ref 0.35–5.50)

## 2023-01-11 ENCOUNTER — Other Ambulatory Visit: Payer: Self-pay | Admitting: Family

## 2023-01-11 ENCOUNTER — Encounter: Payer: Self-pay | Admitting: Family Medicine

## 2023-01-11 DIAGNOSIS — Z780 Asymptomatic menopausal state: Secondary | ICD-10-CM

## 2023-02-23 ENCOUNTER — Other Ambulatory Visit (HOSPITAL_BASED_OUTPATIENT_CLINIC_OR_DEPARTMENT_OTHER): Payer: Medicare Other

## 2023-02-23 ENCOUNTER — Inpatient Hospital Stay (HOSPITAL_BASED_OUTPATIENT_CLINIC_OR_DEPARTMENT_OTHER): Admission: RE | Admit: 2023-02-23 | Payer: Medicare Other | Source: Ambulatory Visit

## 2023-03-13 ENCOUNTER — Ambulatory Visit (HOSPITAL_BASED_OUTPATIENT_CLINIC_OR_DEPARTMENT_OTHER)
Admission: RE | Admit: 2023-03-13 | Discharge: 2023-03-13 | Disposition: A | Discharge status: Home / Self Care | Payer: Medicare Other | Source: Ambulatory Visit | Attending: Family | Admitting: Family

## 2023-03-13 ENCOUNTER — Ambulatory Visit (HOSPITAL_BASED_OUTPATIENT_CLINIC_OR_DEPARTMENT_OTHER)
Admission: RE | Admit: 2023-03-13 | Discharge: 2023-03-13 | Disposition: A | Discharge status: Home / Self Care | Payer: Medicare Other | Source: Ambulatory Visit | Attending: Family Medicine | Admitting: Family Medicine

## 2023-03-13 ENCOUNTER — Other Ambulatory Visit (HOSPITAL_BASED_OUTPATIENT_CLINIC_OR_DEPARTMENT_OTHER): Payer: Self-pay

## 2023-03-13 ENCOUNTER — Encounter (HOSPITAL_BASED_OUTPATIENT_CLINIC_OR_DEPARTMENT_OTHER): Payer: Self-pay

## 2023-03-13 DIAGNOSIS — Z780 Asymptomatic menopausal state: Secondary | ICD-10-CM | POA: Diagnosis not present

## 2023-03-13 DIAGNOSIS — M85832 Other specified disorders of bone density and structure, left forearm: Secondary | ICD-10-CM | POA: Diagnosis not present

## 2023-03-13 DIAGNOSIS — Z1231 Encounter for screening mammogram for malignant neoplasm of breast: Secondary | ICD-10-CM | POA: Diagnosis not present

## 2023-03-13 MED ORDER — ZOSTER VAC RECOMB ADJUVANTED 50 MCG/0.5ML IM SUSR
0.5000 mL | Freq: Once | INTRAMUSCULAR | 0 refills | Status: AC
  Filled 2023-03-13: qty 0.5, 1d supply, fill #0

## 2023-04-06 ENCOUNTER — Other Ambulatory Visit: Payer: Self-pay | Admitting: Family Medicine

## 2023-07-10 ENCOUNTER — Other Ambulatory Visit: Payer: Self-pay | Admitting: Family Medicine

## 2023-08-03 ENCOUNTER — Ambulatory Visit (INDEPENDENT_AMBULATORY_CARE_PROVIDER_SITE_OTHER): Payer: Medicare Other

## 2023-08-03 VITALS — Ht 67.0 in | Wt 185.0 lb

## 2023-08-03 DIAGNOSIS — Z0000 Encounter for general adult medical examination without abnormal findings: Secondary | ICD-10-CM | POA: Diagnosis not present

## 2023-08-03 NOTE — Patient Instructions (Addendum)
 Ms. Holtry , Thank you for taking time to come for your Medicare Wellness Visit. I appreciate your ongoing commitment to your health goals. Please review the following plan we discussed and let me know if I can assist you in the future.   Referrals/Orders/Follow-Ups/Clinician Recommendations:   This is a list of the screening recommended for you and due dates:  Health Maintenance  Topic Date Due   Pneumonia Vaccine (1 of 1 - PCV) Never done   DTaP/Tdap/Td vaccine (3 - Td or Tdap) 07/22/2018   COVID-19 Vaccine (4 - 2024-25 season) 12/18/2022   Zoster (Shingles) Vaccine (2 of 2) 05/08/2023   Flu Shot  11/17/2023   Mammogram  03/12/2024   Medicare Annual Wellness Visit  08/02/2024   DEXA scan (bone density measurement)  03/12/2025   Colon Cancer Screening  08/27/2025   Hepatitis C Screening  Completed   HPV Vaccine  Aged Out   Meningitis B Vaccine  Aged Out    Advanced directives: (Copy Requested) Please bring a copy of your health care power of attorney and living will to the office to be added to your chart at your convenience. You can mail to [facility] [address] [place], [place] [address] or email to [email or web address]  Next Medicare Annual Wellness Visit scheduled for next year: Yes

## 2023-08-03 NOTE — Progress Notes (Signed)
 Subjective:   Karen Pierce is a 73 y.o. who presents for a Medicare Wellness preventive visit.  Visit Complete: Virtual I connected with  Karen Pierce on 08/03/23 by a audio enabled telemedicine application and verified that I am speaking with the correct person using two identifiers.  Patient Location: Home  Provider Location: Home Office  I discussed the limitations of evaluation and management by telemedicine. The patient expressed understanding and agreed to proceed.  Vital Signs: Because this visit was a virtual/telehealth visit, some criteria may be missing or patient reported. Any vitals not documented were not able to be obtained and vitals that have been documented are patient reported.    Persons Participating in Visit: Patient.  AWV Questionnaire: No: Patient Medicare AWV questionnaire was not completed prior to this visit.  Cardiac Risk Factors include: advanced age (>46men, >72 women)     Objective:    Today's Vitals   08/03/23 0959  Weight: 185 lb (83.9 kg)  Height: 5\' 7"  (1.702 m)   Body mass index is 28.98 kg/m.     08/03/2023   10:07 AM 07/28/2022   10:42 AM 07/16/2021   11:45 AM 01/15/2015    9:04 AM  Advanced Directives  Does Patient Have a Medical Advance Directive? Yes Yes Yes Yes  Type of Estate agent of [place];Living will Healthcare Power of [place];Living will Healthcare Power of [place];Living will Healthcare Power of [place];Living will  Copy of Healthcare Power of Attorney in Chart? No - copy requested No - copy requested No - copy requested     Current Medications (verified) Outpatient Encounter Medications as of 08/03/2023  Medication Sig   aspirin 81 MG EC tablet Take 81 mg by mouth daily.   atorvastatin (LIPITOR) 10 MG tablet TAKE 1 TABLET(10 MG) BY MOUTH DAILY   calcium carbonate (TUMS - DOSED IN MG ELEMENTAL CALCIUM) 500 MG chewable tablet Chew 1 tablet by mouth daily.   Cholecalciferol (VITAMIN D3) 5000  UNITS CAPS Take 1 capsule by mouth daily.   Krill Oil CAPS Take 1 capsule by mouth daily.    Multiple Vitamins-Minerals (MULTIVITAMIN WITH MINERALS) tablet Take 1 tablet by mouth daily.   Probiotic Product (PROBIOTIC DAILY PO) Take 1 capsule by mouth daily.    No facility-administered encounter medications on file as of 08/03/2023.    Allergies (verified) Patient has no known allergies.   History: Past Medical History:  Diagnosis Date   Anxiety and depression 11/16/2012   Breast cyst    dense   Chicken pox as a kid   Colon polyp    GERD (gastroesophageal reflux disease)    Hiatal hernia    History of colonic polyps 01/15/2015   History of shingles 10/04/2012   Hyperlipidemia    Kidney stone 10/04/2012   Other and unspecified hyperlipidemia 10/04/2012   Overweight 11/29/2016   Preventative health care 10/04/2012   Renal artery stenosis (HCC)    kidney stone   Past Surgical History:  Procedure Laterality Date   cyst removed off left side  1968   benign, skin   POLPYPECTOMY  08/2004   Family History  Problem Relation Age of Onset   Arthritis Mother    Heart disease Mother    Cancer Mother 5       breast, sarcoma x 3 bouts in past, under righ arm   Hyperlipidemia Mother    Ulcers Father    Hypertension Father    Arthritis Father    Heart disease Father  CHF   Macular degeneration Father    Heart disease Maternal Grandmother    Heart disease Paternal Grandmother    Heart disease Paternal Grandfather    Ulcers Paternal Grandfather    Cancer Brother        pancreatic   Heart disease Maternal Grandfather    Heart attack Maternal Grandfather    Pneumonia Sister    Arthritis Sister        rheumatoid   Arthritis Sister        s/p TKR   Social History   Socioeconomic History   Marital status: Married    Spouse name: Pam   Number of children: Not on file   Years of education: Not on file   Highest education level: 12th grade  Occupational History   Not on  file  Tobacco Use   Smoking status: Former    Current packs/day: 0.50    Average packs/day: 0.5 packs/day for 35.3 years (17.6 ttl pk-yrs)    Types: Cigarettes    Start date: 04/18/1988   Smokeless tobacco: Never  Vaping Use   Vaping status: Never Used  Substance and Sexual Activity   Alcohol use: Yes    Comment: occasionally   Drug use: No   Sexual activity: Yes    Partners: Male  Other Topics Concern   Not on file  Social History Narrative   Not on file   Social Drivers of Health   Financial Resource Strain: Low Risk  (08/03/2023)   Overall Financial Resource Strain (CARDIA)    Difficulty of Paying Living Expenses: Not hard at all  Food Insecurity: No Food Insecurity (08/03/2023)   Hunger Vital Sign    Worried About Running Out of Food in the Last Year: Never true    Ran Out of Food in the Last Year: Never true  Transportation Needs: No Transportation Needs (08/03/2023)   PRAPARE - Administrator, Civil Service (Medical): No    Lack of Transportation (Non-Medical): No  Physical Activity: Insufficiently Active (08/03/2023)   Exercise Vital Sign    Days of Exercise per Week: 3 days    Minutes of Exercise per Session: 30 min  Stress: No Stress Concern Present (08/03/2023)   [employer] of Occupational Health - Occupational Stress Questionnaire    Feeling of Stress : Not at all  Social Connections: Moderately Isolated (08/03/2023)   Social Connection and Isolation Panel [NHANES]    Frequency of Communication with Friends and Family: More than three times a week    Frequency of Social Gatherings with Friends and Family: More than three times a week    Attends Religious Services: Never    Database administrator or Organizations: No    Attends Engineer, structural: Never    Marital Status: Married    Tobacco Counseling Counseling given: Not Answered    Clinical Intake:  Pre-visit preparation completed: Yes  Pain : No/denies pain     BMI -  recorded: 28.98 Nutritional Status: BMI 25 -29 Overweight Nutritional Risks: None Diabetes: No  Lab Results  Component Value Date   HGBA1C 5.3 11/07/2012     How often do you need to have someone help you when you read instructions, pamphlets, or other written materials from your doctor or pharmacy?: 1 - Never  Interpreter Needed?: No  Information entered by :: Farris Hong LPN   Activities of Daily Living     08/03/2023   10:04 AM  In your present state  of health, do you have any difficulty performing the following activities:  Hearing? 0  Vision? 0  Difficulty concentrating or making decisions? 0  Walking or climbing stairs? 0  Dressing or bathing? 0  Doing errands, shopping? 0  Preparing Food and eating ? N  Using the Toilet? N  In the past six months, have you accidently leaked urine? N  Do you have problems with loss of bowel control? N  Managing your Medications? N  Managing your Finances? N  Housekeeping or managing your Housekeeping? N    Patient Care Team: Bradd Canary, MD as PCP - General (Family Medicine) Kathleene Hazel, MD as Consulting Physician (Cardiology)  Indicate any recent Medical Services you may have received from other than [facility] providers in the past year (date may be approximate).     Assessment:   This is a routine wellness examination for [place].  Hearing/Vision screen Hearing Screening - Comments:: Denies hearing difficulties   Vision Screening - Comments:: Wears rx glasses - up to date with routine eye exams with  Dr Dione Booze   Goals Addressed               This Visit's Progress     Increase physical activity (pt-stated)        Remain active       Depression Screen      08/03/2023   10:03 AM 01/09/2023    2:59 PM 07/28/2022   10:41 AM 10/07/2021    1:44 PM 07/16/2021   11:46 AM 05/17/2021    3:28 PM 05/17/2021    3:25 PM  PHQ 2/9 Scores  PHQ - 2 Score 0 0 0 0 0 0 0  PHQ- 9 Score  0  0 0      Fall Risk       08/03/2023   10:04 AM 01/09/2023    3:02 PM 01/09/2023    2:58 PM 07/28/2022   10:38 AM 10/07/2021    1:44 PM  Fall Risk   Falls in the past year? 1 0 0 0 0  Number falls in past yr: 0 0 0 0 0  Injury with Fall? 1 0 0 0 0  Comment Injured nose. without medical attention.      Risk for fall due to :    No Fall Risks No Fall Risks  Follow up Falls prevention discussed;Falls evaluation completed Falls evaluation completed Falls evaluation completed Falls prevention discussed Falls evaluation completed    MEDICARE RISK AT HOME:   Medicare Risk at Home Any stairs in or around the home?: No If so, are there any without handrails?: No Home free of loose throw rugs in walkways, pet beds, electrical cords, etc?: Yes Adequate lighting in your home to reduce risk of falls?: Yes Life alert?: No Use of a cane, walker or w/c?: No Grab bars in the bathroom?: Yes Shower chair or bench in shower?: No Elevated toilet seat or a handicapped toilet?: Yes  TIMED UP AND GO:  Was the test performed?  No  Cognitive Function: 6CIT completed        08/03/2023   10:07 AM 07/28/2022   10:43 AM 07/16/2021   11:48 AM  6CIT Screen  What Year? 0 points 0 points 0 points  What month? 0 points 0 points 0 points  What time? 0 points 0 points 0 points  Count back from 20 0 points 0 points 0 points  Months in reverse 0 points 0 points 2 points  Repeat phrase 0 points 0 points 0 points  Total Score 0 points 0 points 2 points    Immunizations Immunization History  Administered Date(s) Administered   Fluad Quad(high Dose 65+) 02/01/2022   Fluad Trivalent(High Dose 65+) 01/09/2023   Influenza Split 01/27/2012   Influenza-Unspecified 02/02/2015, 04/17/2021   PFIZER(Purple Top)SARS-COV-2 Vaccination 07/25/2019, 08/20/2019, 03/24/2020   Td 10/24/1997   Tdap 07/21/2008   Zoster Recombinant(Shingrix) 03/13/2023   Zoster, Live 01/15/2015    Screening Tests Health Maintenance  Topic Date Due   Pneumonia  Vaccine 78+ Years old (1 of 1 - PCV) Never done   DTaP/Tdap/Td (3 - Td or Tdap) 07/22/2018   COVID-19 Vaccine (4 - 2024-25 season) 12/18/2022   Zoster Vaccines- Shingrix (2 of 2) 05/08/2023   INFLUENZA VACCINE  11/17/2023   MAMMOGRAM  03/12/2024   Medicare Annual Wellness (AWV)  08/02/2024   DEXA SCAN  03/12/2025   Colonoscopy  08/27/2025   Hepatitis C Screening  Completed   HPV VACCINES  Aged Out   Meningococcal B Vaccine  Aged Out    Health Maintenance  Health Maintenance Due  Topic Date Due   Pneumonia Vaccine 47+ Years old (1 of 1 - PCV) Never done   DTaP/Tdap/Td (3 - Td or Tdap) 07/22/2018   COVID-19 Vaccine (4 - 2024-25 season) 12/18/2022   Zoster Vaccines- Shingrix (2 of 2) 05/08/2023   Health Maintenance Items Addressed: Zoster/shingles 2nd pending. Pneumonia and Dtap/Tdap Vaccines deferred  Additional Screening:  Vision Screening: Recommended annual ophthalmology exams for early detection of glaucoma and other disorders of the eye.  Dental Screening: Recommended annual dental exams for proper oral hygiene  Community Resource Referral / Chronic Care Management: CRR required this visit?  No   CCM required this visit?  No     Plan:     I have personally reviewed and noted the following in the patient's chart:   Medical and social history Use of alcohol, tobacco or illicit drugs  Current medications and supplements including opioid prescriptions. Patient is not currently taking opioid prescriptions. Functional ability and status Nutritional status Physical activity Advanced directives List of other physicians Hospitalizations, surgeries, and ER visits in previous 12 months Vitals Screenings to include cognitive, depression, and falls Referrals and appointments  In addition, I have reviewed and discussed with patient certain preventive protocols, quality metrics, and best practice recommendations. A written personalized care plan for preventive services as  well as general preventive health recommendations were provided to patient.     Dewayne Ford, LPN   [id number]   After Visit Summary: (MyChart) Due to this being a telephonic visit, the after visit summary with patients personalized plan was offered to patient via MyChart   Notes: Nothing significant to report at this time.

## 2023-09-08 ENCOUNTER — Other Ambulatory Visit (HOSPITAL_BASED_OUTPATIENT_CLINIC_OR_DEPARTMENT_OTHER): Payer: Self-pay

## 2023-09-09 IMAGING — MG MM DIGITAL SCREENING BILAT W/ TOMO AND CAD
8 series · 8 of 24 positions shown · non-contrast
Comparison: Previous exam(s).

CLINICAL DATA: Screening.

EXAM:
DIGITAL SCREENING BILATERAL MAMMOGRAM WITH TOMOSYNTHESIS AND CAD
TECHNIQUE: Bilateral screening digital craniocaudal and mediolateral oblique
mammograms were obtained. Bilateral screening digital breast
tomosynthesis was performed. The images were evaluated with
computer-aided detection.

[L CC synth-2D]
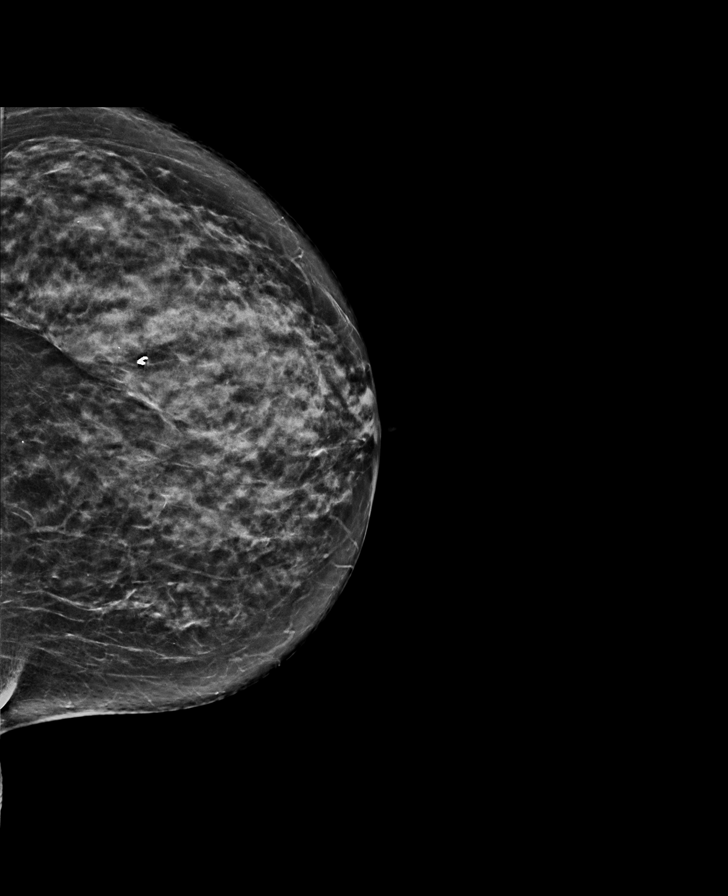

[R CC synth-2D]
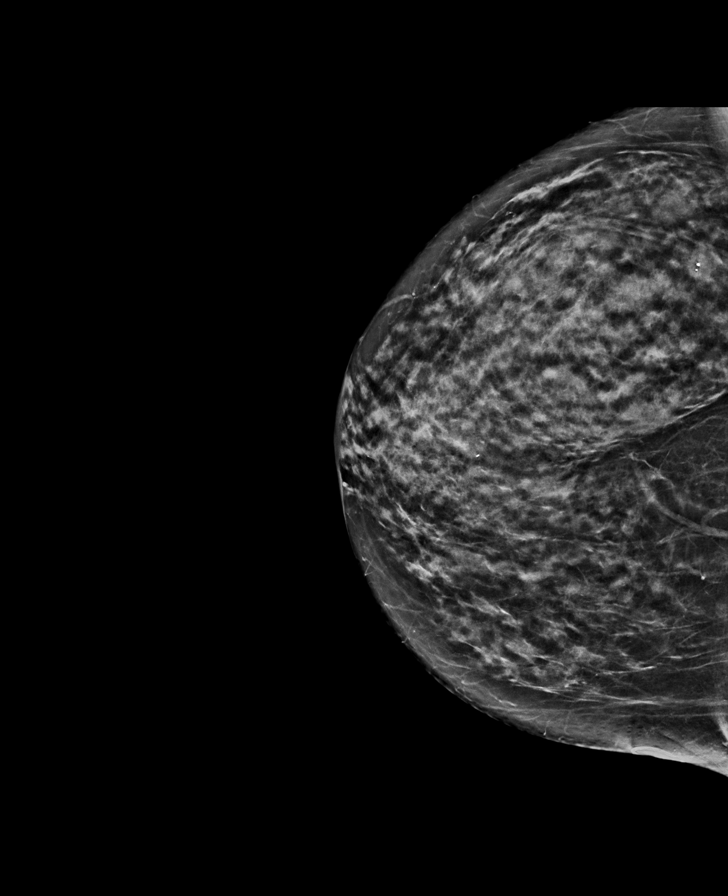

[L MLO synth-2D]
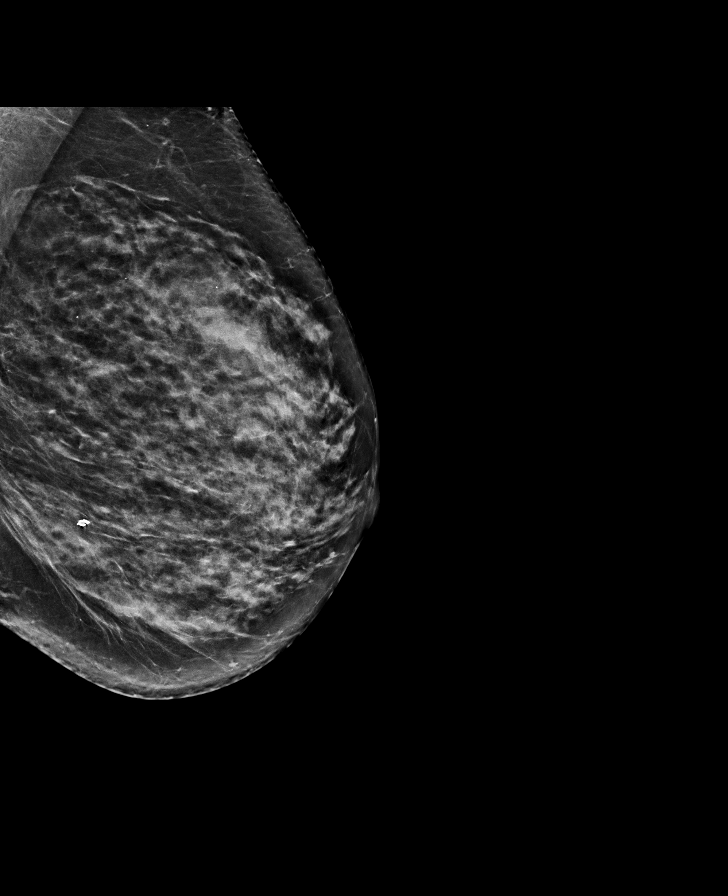

[R MLO synth-2D]
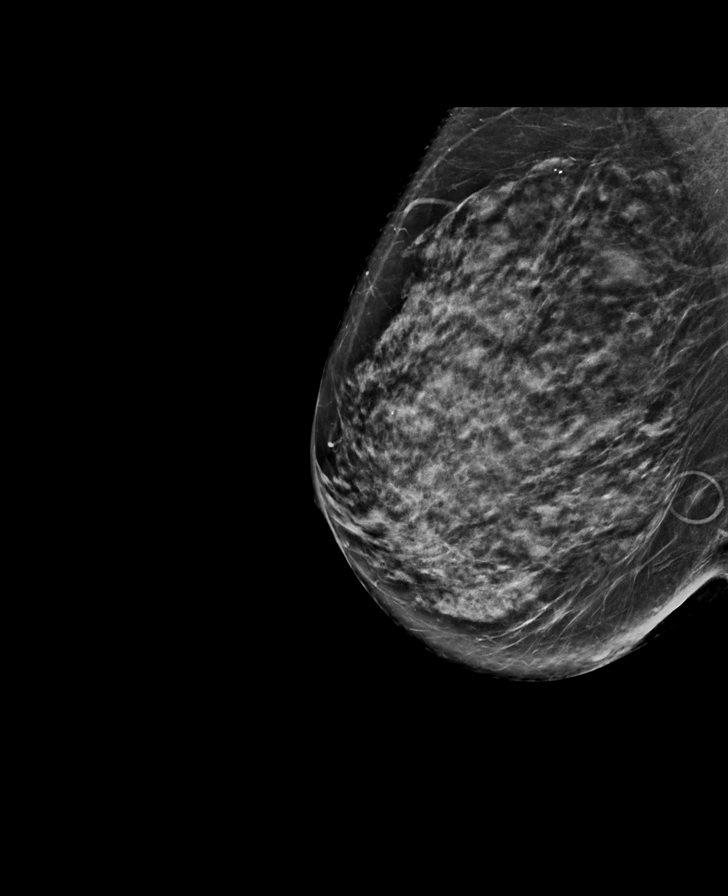

[R MLO tomo · tomo slice 37/74.0]
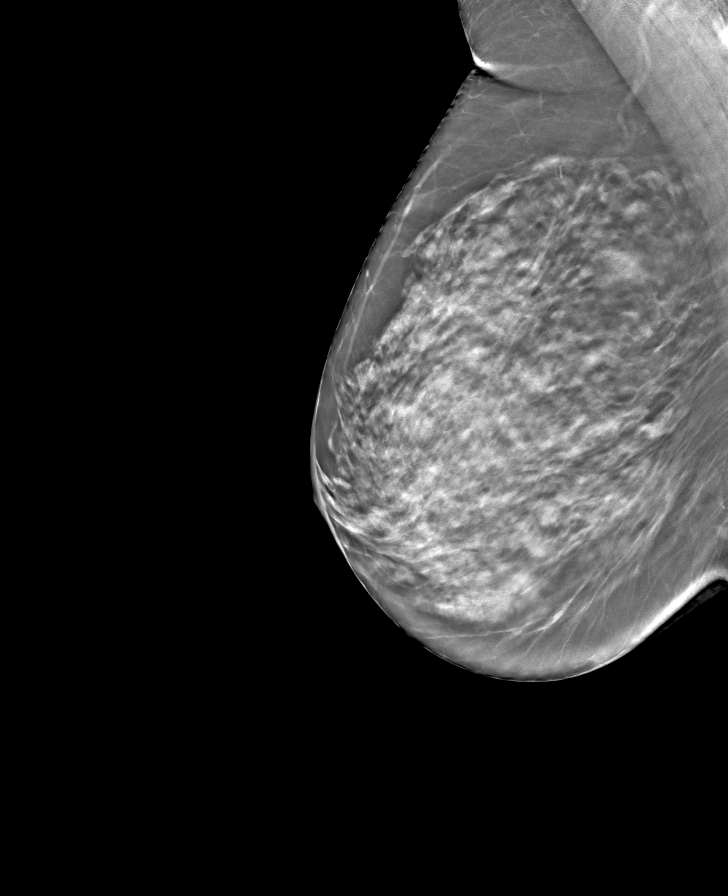

[R CC tomo · tomo slice 33/64.0]
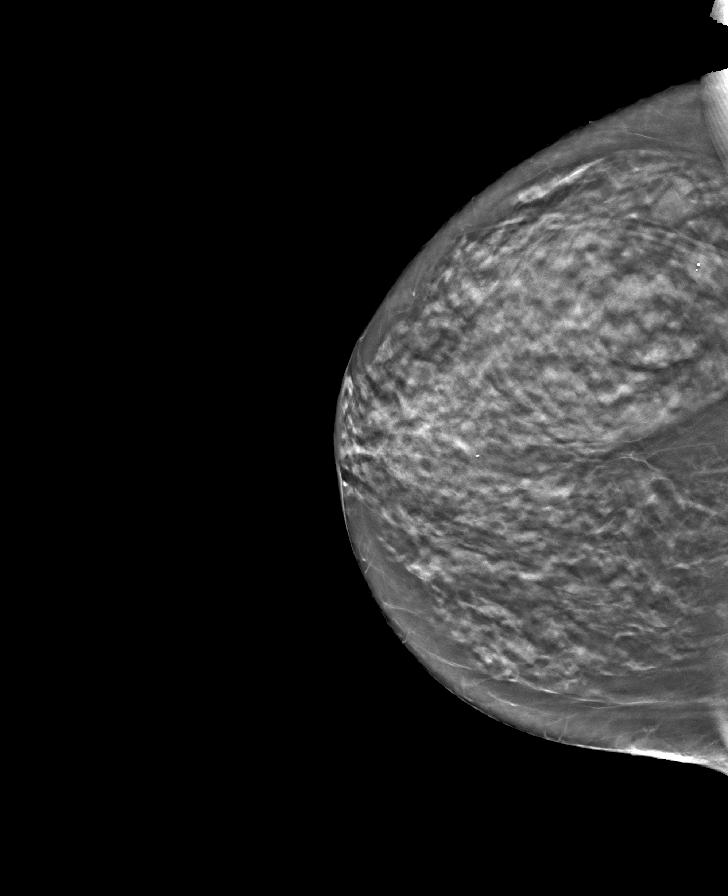

[L MLO tomo · tomo slice 37/73.0]
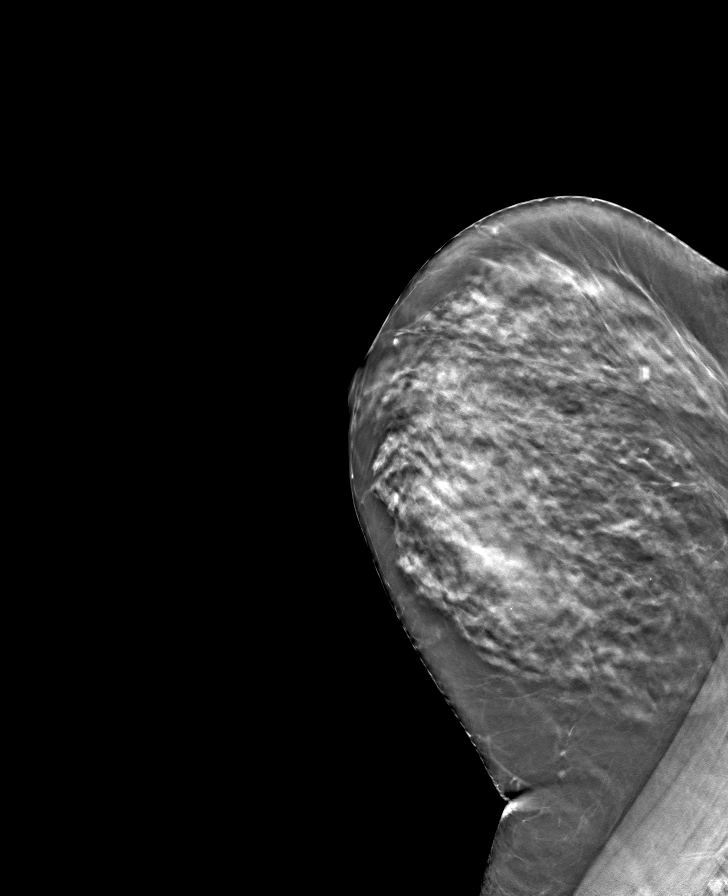

[L CC tomo · tomo slice 33/64.0]
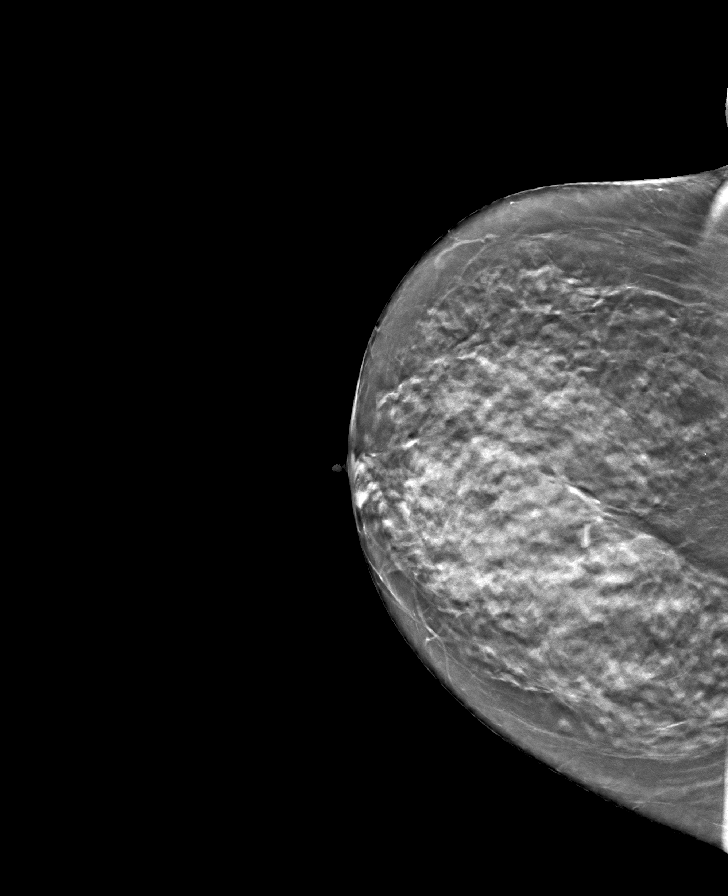

[8 of 24 positions shown; findings below may reference images not displayed]

ACR Breast Density Category c: The breast tissue is heterogeneously
dense, which may obscure small masses.
FINDINGS: There are no findings suspicious for malignancy.
IMPRESSION: No mammographic evidence of malignancy. A result letter of this
screening mammogram will be mailed directly to the patient.

RECOMMENDATION:
Screening mammogram in one year. (Code:Q3-W-BC3)

BI-RADS CATEGORY  1: Negative.

## 2023-10-08 ENCOUNTER — Other Ambulatory Visit: Payer: Self-pay | Admitting: Family Medicine

## 2023-11-07 ENCOUNTER — Other Ambulatory Visit: Payer: Self-pay | Admitting: Family Medicine

## 2023-11-22 DIAGNOSIS — K080 Exfoliation of teeth due to systemic causes: Secondary | ICD-10-CM | POA: Diagnosis not present

## 2023-11-24 DIAGNOSIS — K080 Exfoliation of teeth due to systemic causes: Secondary | ICD-10-CM | POA: Diagnosis not present

## 2024-01-31 DIAGNOSIS — K080 Exfoliation of teeth due to systemic causes: Secondary | ICD-10-CM | POA: Diagnosis not present

## 2024-03-11 ENCOUNTER — Other Ambulatory Visit (HOSPITAL_BASED_OUTPATIENT_CLINIC_OR_DEPARTMENT_OTHER): Payer: Self-pay

## 2024-03-11 MED ORDER — FLUZONE HIGH-DOSE 0.5 ML IM SUSY
0.5000 mL | PREFILLED_SYRINGE | Freq: Once | INTRAMUSCULAR | 0 refills | Status: AC
  Filled 2024-03-11: qty 0.5, 1d supply, fill #0

## 2024-04-02 IMAGING — DX DG CHEST 2V
2 series · 2 of 2 positions shown · non-contrast
Comparison: None Available.

CLINICAL DATA: Cough

EXAM:
CHEST - 2 VIEW

[chest pa]
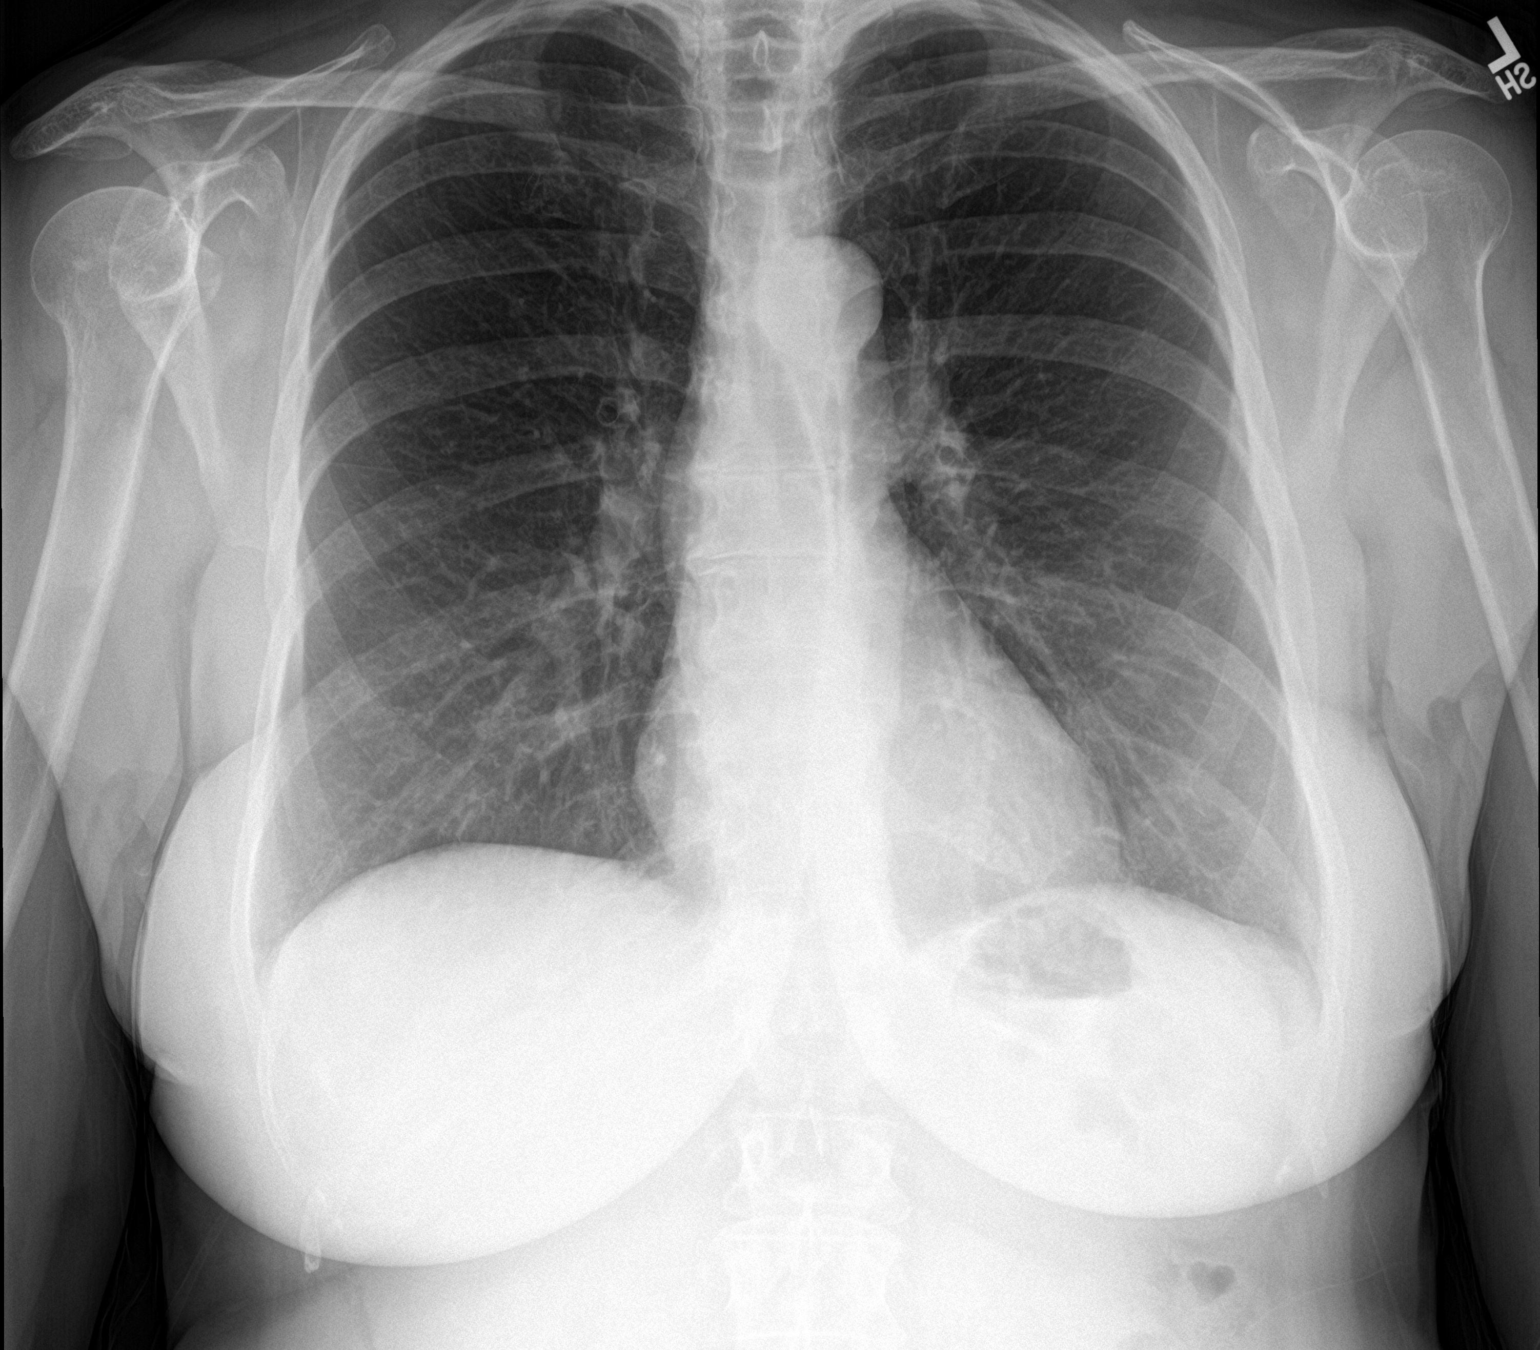

[chest lat]
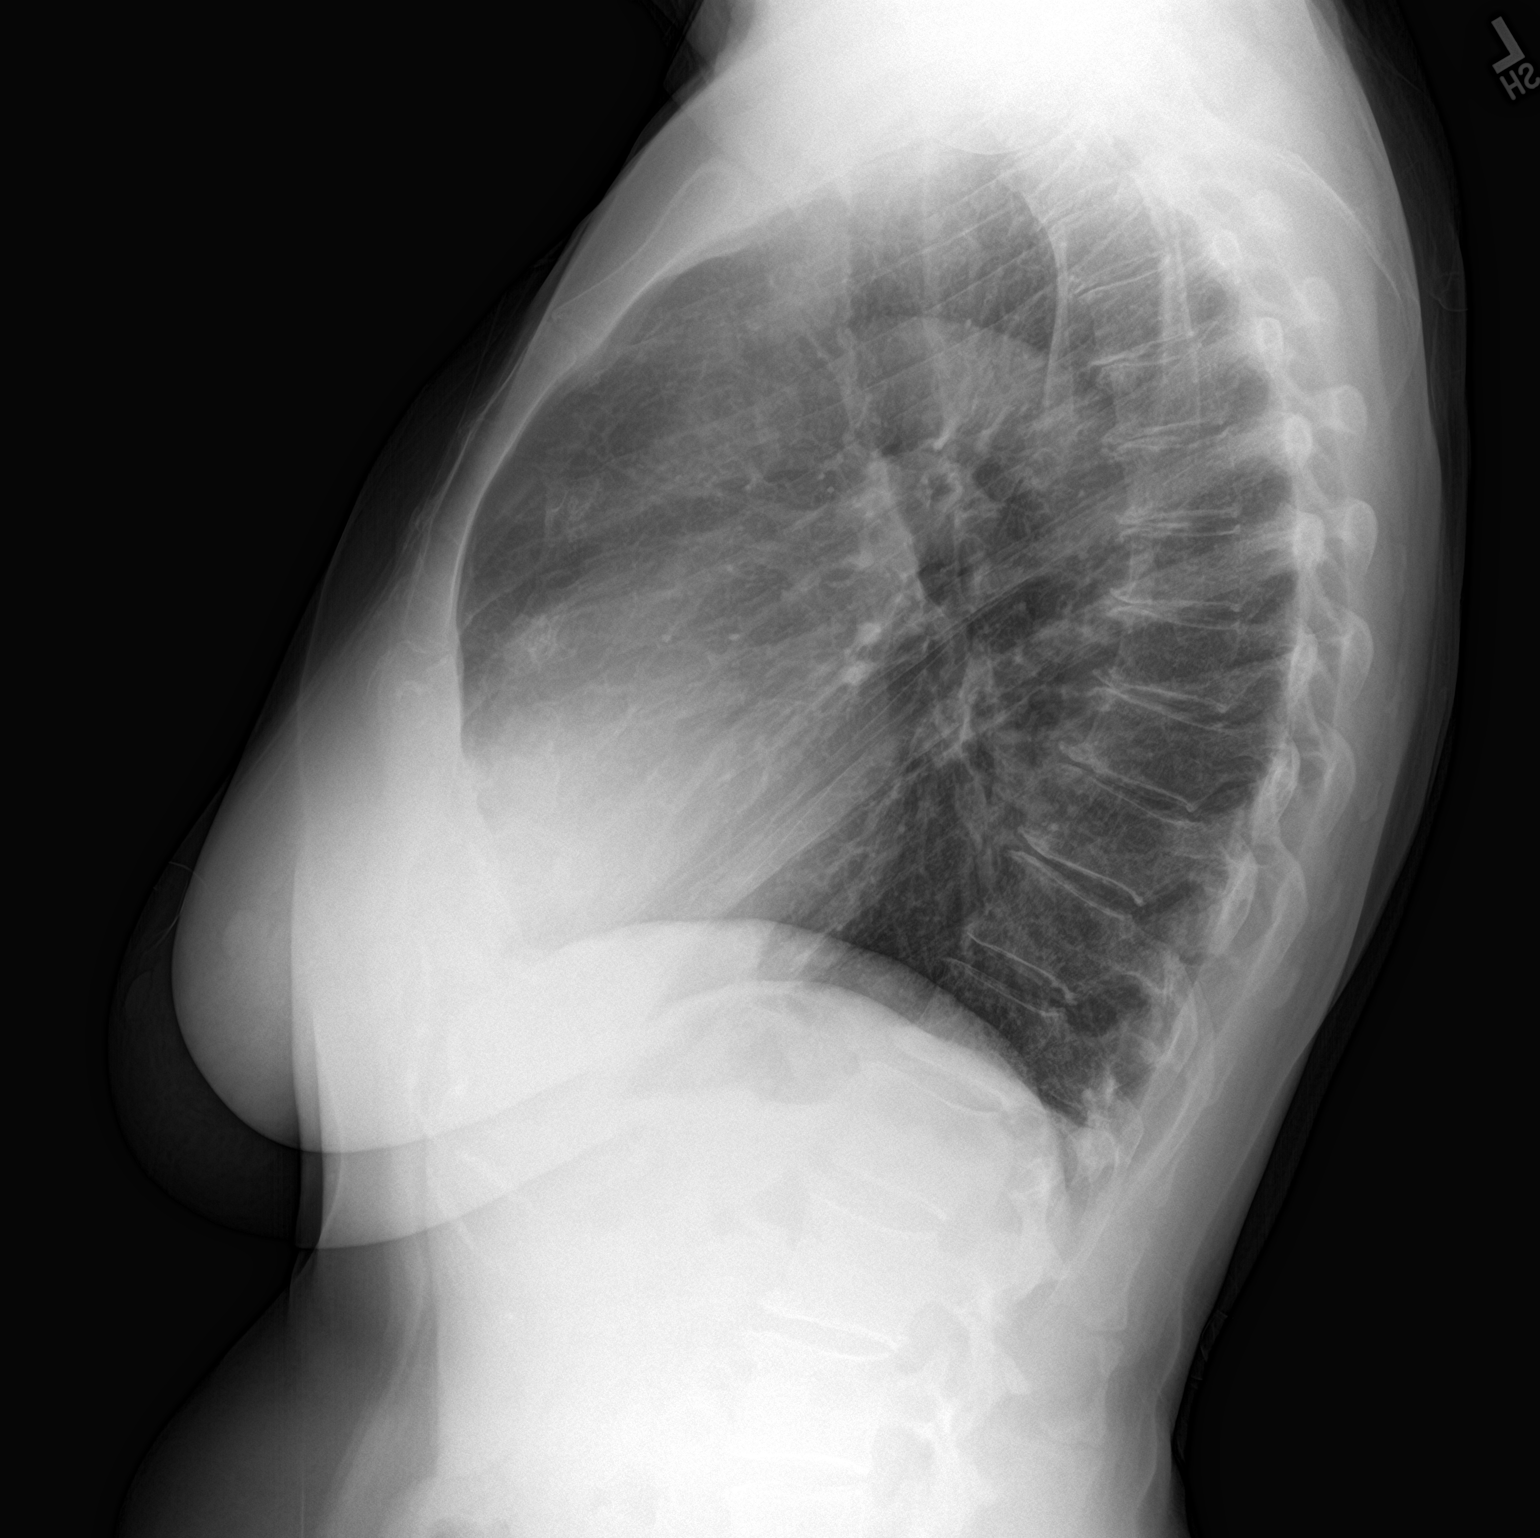

[2 of 2 positions shown; findings below may reference images not displayed]

FINDINGS: The heart size and mediastinal contours are within normal limits.
Both lungs are clear. The visualized skeletal structures are
unremarkable.
IMPRESSION: No active cardiopulmonary disease.

## 2024-05-14 ENCOUNTER — Telehealth: Payer: Self-pay

## 2024-05-14 ENCOUNTER — Other Ambulatory Visit (HOSPITAL_BASED_OUTPATIENT_CLINIC_OR_DEPARTMENT_OTHER): Payer: Self-pay

## 2024-05-14 MED ORDER — ATORVASTATIN CALCIUM 10 MG PO TABS
10.0000 mg | ORAL_TABLET | Freq: Every day | ORAL | 1 refills | Status: AC
  Filled 2024-05-14: qty 90, 90d supply, fill #0

## 2024-05-14 NOTE — Telephone Encounter (Signed)
 Copied from CRM #[id number]. Topic: Clinical - Medication Question >> May 14, 2024  3:48 PM Robinson H wrote: Reason for CRM: Patient calling to see if Dr. Domenica will call her in a refill for her atorvastatin  (LIPITOR) 10 MG tablet, advised patient she hasn't seen provider in over a year and may not be refilled. Patient is out of medication and has a TOC appointment for 6/12.  Karen Pierce [phone]

## 2024-08-05 ENCOUNTER — Ambulatory Visit: Admitting: Family Medicine

## 2024-08-08 ENCOUNTER — Ambulatory Visit

## 2024-09-27 ENCOUNTER — Encounter: Admitting: Student
